# Patient Record
Sex: Female | Born: 1950
Health system: Southern US, Community
[De-identification: ages and names within clinical notes are randomized; demographics above are authoritative.]

## PROBLEM LIST (undated history)

## (undated) DIAGNOSIS — Z8619 Personal history of other infectious and parasitic diseases: Secondary | ICD-10-CM

## (undated) DIAGNOSIS — J449 Chronic obstructive pulmonary disease, unspecified: Secondary | ICD-10-CM

## (undated) DIAGNOSIS — N63 Unspecified lump in unspecified breast: Secondary | ICD-10-CM

## (undated) DIAGNOSIS — E042 Nontoxic multinodular goiter: Secondary | ICD-10-CM

## (undated) HISTORY — PX: ROTATOR CUFF REPAIR: SHX139

## (undated) HISTORY — PX: OTHER SURGICAL HISTORY: SHX169

## (undated) HISTORY — PX: TUBAL LIGATION: SHX77

## (undated) HISTORY — PX: BREAST EXCISIONAL BIOPSY: SUR124

---

## 2000-03-20 ENCOUNTER — Encounter: Admission: RE | Admit: 2000-03-20 | Discharge: 2000-03-20 | Payer: Self-pay | Admitting: Obstetrics and Gynecology

## 2000-03-20 ENCOUNTER — Encounter: Payer: Self-pay | Admitting: Obstetrics and Gynecology

## 2000-05-05 ENCOUNTER — Ambulatory Visit (HOSPITAL_BASED_OUTPATIENT_CLINIC_OR_DEPARTMENT_OTHER): Admission: RE | Admit: 2000-05-05 | Discharge: 2000-05-05 | Payer: Self-pay | Admitting: Orthopedic Surgery

## 2001-03-20 ENCOUNTER — Encounter: Admission: RE | Admit: 2001-03-20 | Discharge: 2001-03-20 | Payer: Self-pay | Admitting: Obstetrics and Gynecology

## 2001-03-20 ENCOUNTER — Encounter: Payer: Self-pay | Admitting: Obstetrics and Gynecology

## 2001-03-23 ENCOUNTER — Other Ambulatory Visit: Admission: RE | Admit: 2001-03-23 | Discharge: 2001-03-23 | Payer: Self-pay | Admitting: General Surgery

## 2001-04-07 ENCOUNTER — Ambulatory Visit (HOSPITAL_COMMUNITY): Admission: RE | Admit: 2001-04-07 | Discharge: 2001-04-07 | Payer: Self-pay | Admitting: General Surgery

## 2001-04-07 ENCOUNTER — Encounter (INDEPENDENT_AMBULATORY_CARE_PROVIDER_SITE_OTHER): Payer: Self-pay | Admitting: Specialist

## 2001-04-07 ENCOUNTER — Encounter: Payer: Self-pay | Admitting: General Surgery

## 2001-09-17 ENCOUNTER — Ambulatory Visit (HOSPITAL_BASED_OUTPATIENT_CLINIC_OR_DEPARTMENT_OTHER): Admission: RE | Admit: 2001-09-17 | Discharge: 2001-09-17 | Payer: Self-pay | Admitting: Obstetrics and Gynecology

## 2001-09-17 ENCOUNTER — Encounter (INDEPENDENT_AMBULATORY_CARE_PROVIDER_SITE_OTHER): Payer: Self-pay | Admitting: Specialist

## 2002-03-29 ENCOUNTER — Encounter: Payer: Self-pay | Admitting: Obstetrics and Gynecology

## 2002-03-29 ENCOUNTER — Encounter: Admission: RE | Admit: 2002-03-29 | Discharge: 2002-03-29 | Payer: Self-pay | Admitting: Obstetrics and Gynecology

## 2003-02-15 ENCOUNTER — Ambulatory Visit (HOSPITAL_BASED_OUTPATIENT_CLINIC_OR_DEPARTMENT_OTHER): Admission: RE | Admit: 2003-02-15 | Discharge: 2003-02-15 | Payer: Self-pay | Admitting: *Deleted

## 2003-05-11 ENCOUNTER — Encounter: Admission: RE | Admit: 2003-05-11 | Discharge: 2003-05-11 | Payer: Self-pay | Admitting: Obstetrics and Gynecology

## 2004-07-27 ENCOUNTER — Encounter: Admission: RE | Admit: 2004-07-27 | Discharge: 2004-07-27 | Payer: Self-pay | Admitting: Obstetrics and Gynecology

## 2005-10-29 ENCOUNTER — Encounter: Admission: RE | Admit: 2005-10-29 | Discharge: 2005-10-29 | Payer: Self-pay | Admitting: Obstetrics and Gynecology

## 2007-01-22 ENCOUNTER — Encounter: Admission: RE | Admit: 2007-01-22 | Discharge: 2007-01-22 | Payer: Self-pay | Admitting: Obstetrics and Gynecology

## 2008-02-23 ENCOUNTER — Encounter: Admission: RE | Admit: 2008-02-23 | Discharge: 2008-02-23 | Payer: Self-pay | Admitting: Obstetrics and Gynecology

## 2009-05-18 ENCOUNTER — Encounter: Admission: RE | Admit: 2009-05-18 | Discharge: 2009-05-18 | Payer: Self-pay | Admitting: Obstetrics and Gynecology

## 2010-05-21 ENCOUNTER — Encounter: Admission: RE | Admit: 2010-05-21 | Discharge: 2010-05-21 | Payer: Self-pay | Admitting: Obstetrics and Gynecology

## 2010-11-16 NOTE — Op Note (Signed)
[place]  Patient:    Cassandra Irwin, Cassandra Irwin Visit Number: [id number] MRN: [id number]          Service Type: DSU Location: DAY Attending Physician:  Carson Myrtle Dictated by:   Sheppard Plumber Earlene Plater, M.D. Proc. Date: 04/07/01 Admit Date:  04/07/2001   CC:         Katherine Roan, M.D.   Operative Report  PREOPERATIVE DIAGNOSIS:  Left breast mass.  POSTOPERATIVE DIAGNOSIS:  Left breast mass.  OPERATION:  Excisional biopsy left breast mass.  SURGEON:  Timothy E. Earlene Plater, M.D.  ANESTHESIA: Local standby.  INDICATIONS:  [name] is well known to me.  She is a 60 year old Caucasian female.  She has had a prior right breast biopsy.  Routine mammography indeed showed what was a palpable mass at the 12 oclock position of the left breast. It was irregular and suspicious.  An office aspiration cytology was suggestive of fibroadenoma.  Because of the suspicion and her age, we decided to do a full excisional biopsy.  DESCRIPTION OF PROCEDURE: The patient was brought to the operating room and placed supine. An IV was started and sedation given.  The left breast was prepped and draped in the usual fashion.  Marcaine 0.25% was epinephrine was used for local anesthesia.  A curvilinear incision was made over the palpable mass and the 12 oclock position of the left breast well outside the areolar edge.  The mass was easily palpable, and I dissected the subcutaneous fatty tissue around and about the mass and did a very generous biopsy including the tissue surrounding the palpable mass.  I encountered severe fibrocystic changes, necrosis, inspissated fluid and multiple cysts.  The entire mass was removed, marked and I personally took it to pathology where it was inked and dissected.  This appears to be extensive fibrocystic disease surrounding an approximately 2 cm fibroadenoma.  We did not freeze the tissue.  The wound was carefully analyzed.  Cautery  was used for coagulation and the wound was dry.  It was approximated with 3-0 Monocryl and the skin closed with 3-0 Monocryl subcuticular.  She tolerated it well.  Steri-Strips and dry sterile dressing were applied.  The counts were correct.  She was removed to the recovery room in good condition.  Written and verbal instructions were given to her and her husband, Dr. Julieanne Manson, and she will be followed as an outpatient. Dictated by:   Sheppard Plumber Earlene Plater, M.D. Attending Physician:  Carson Myrtle DD:  04/07/01 TD:  04/07/01 Job: [id number]

## 2010-11-16 NOTE — Op Note (Signed)
NAME:  Cassandra Irwin, Cassandra Irwin                        ACCOUNT NO.:  [id number]   MEDICAL RECORD NO.:  [id number]                   PATIENT TYPE:  AMB   LOCATION:  DSC                                  FACILITY:  [facility]   PHYSICIAN:  Robert A. Thurston Hole, M.D.              DATE OF BIRTH:  1951/01/18   DATE OF PROCEDURE:  02/15/2003  DATE OF DISCHARGE:                                 OPERATIVE REPORT   PREOPERATIVE DIAGNOSIS:  Right shoulder rotator cuff tendonitis with  adhesive capsulitis and impingement.   POSTOPERATIVE DIAGNOSIS:  Right shoulder rotator cuff tendonitis with  adhesive capsulitis and impingement.   PROCEDURE:  1. Right shoulder examination under anesthesia followed by manipulation.  2. Right shoulder arthroscopy with partial synovectomy.  3. Right shoulder subacromial decompression.   SURGEON:  Elana Alm. Thurston Hole, M.D.   ASSISTANT:  Julien Girt, P.A.   ANESTHESIA:  General.   OPERATIVE TIME:  Forty minutes.   COMPLICATIONS:  None.   INDICATIONS FOR PROCEDURE:  The patient is a 60 year old woman who has had  significant right shoulder pain for the past six to eight weeks increasing  in nature with signs and symptoms and MRI documenting rotator cuff  tendonitis with adhesive capsulitis and impingement.  She has failed  conservative care and is in fact getting worse.  She is now to undergo  arthroscopy and manipulation.   DESCRIPTION OF PROCEDURE:  The patient was brought to the operating room on  February 15, 2003 after an interscalene block had been placed by anesthesia  for postoperative pain control.  She was placed on the operative table in  the supine position.  After being placed under general anesthesia initial  range of motion showed forward flexion to 150, abduction to 150, internal  and external rotation of 50 degrees. A gentle manipulation was carried out  breaking up soft adhesions and improving forward flexion to 180, adduction  to 180 and internal  and external rotation of 90 degrees.  The shoulder  remained stable to ligamentous exam.  After this was done she was placed in  the beach chair position.  Her shoulder and arm was prepped using sterile  Duraprep and draped using sterile technique.  Originally through a posterior  arthroscopic portal, the arthroscope with a pump attached was placed into an  anterior portal and an arthroscopic probe was placed.  On initial inspection  the articular cartilage and the glenohumeral joint were intact.  The  anterior and posterior labrum were intact. The inferior labrum and anterior  inferior glenohumeral ligament complex was intact.  The superior labrum and  the biceps tendon anchor were intact and the biceps tendon was intact.  The  posterior labrum was intact.  There was significant hemorrhagic synovitis  throughout the joint and this was partially debrided.  The rotator cuff was  inspected and there was found to be no evidence of a tear.  The  subacromial  space was entered and a lateral arthroscopic portal was made.  Moderately  thickened bursitis was resected.  The rotator cuff under this was inflamed  and somewhat swollen, but no evidence of a tear was noted. After this was  done a subacromial decompression was carried out removing 6 to 8 mm of the  undersurface of the anterior, anterior lateral and anterior medial acromion.  A CA ligament release was carried out as well.  The acromioclavicular joint  was not disturbed.  After this was done, it was felt that all pathology had  been satisfactorily addressed.  The instruments were removed. A combination  of 40 mg of Depo-Medrol and 6 mL of Marcaine was then injected into the  joint to help relieve the synovitis.  The arthroscopic portals were then  closed with 3-0 nylon and then sterile dressings were applied and a sling  applied. The patient was then awakened and taken to the recovery room in  stable condition.   FOLLOW-UP CARE:  The  patient will be followed as an outpatient on Percocet  for pain with early physical therapy.  She will return to the office in a  week for sutures out and follow-up.                                               Robert A. Thurston Hole, M.D.    RAW/MEDQ  D:  02/15/2003  T:  02/15/2003  Job:  [id number]

## 2010-11-16 NOTE — Op Note (Signed)
[facility]  Patient:    Cassandra Irwin, Cassandra Irwin Visit Number: [id number] MRN: [id number]          Service Type: NES Location: NESC Attending Physician:  Lendon Colonel Dictated by:   Kathie Rhodes. Kyra Manges, M.D. Proc. Date: 09/17/01 Admit Date:  09/17/2001                             Operative Report  PREOPERATIVE DIAGNOSIS:  Abnormal uterine bleeding.  POSTOPERATIVE DIAGNOSIS:  Abnormal uterine bleeding.  OPERATION PERFORMED:  Hysteroscopy with resection of submucous fibroid in the lower segment and fundal polyp.  DESCRIPTION OF PROCEDURE:  The patient was placed in lithotomy position, prepped and draped in the usual fashion. The cervix carefully dilated and endometrial cavity was then visualized. There was a Schreffler cervical fibroid which was resected and then I was able to put the scope down to the polyp in the fundus. I resected this without difficulty. All of the resecting material was sent to the lab for study. Nakeesha was awakened and carried to the recovery room in good condition. Dictated by:   S. Kyra Manges, M.D. Attending Physician:  Lendon Colonel DD:  09/17/01 TD:  09/18/01 Job: [id number]

## 2010-11-16 NOTE — Op Note (Signed)
[place]  Patient:    Cassandra Irwin, Cassandra Irwin                     MRN: [id number] Proc. Date: 05/05/00 Adm. Date:  [id number] Attending:  Twana First                           Operative Report  PREOPERATIVE DIAGNOSIS:  Left shoulder adhesive capsulitis.  POSTOPERATIVE DIAGNOSES: 1. Left shoulder adhesive capsulitis. 2. Left shoulder partial rotator cuff tear and impingement.  PROCEDURES: 1. Left shoulder EUA followed by manipulation. 2. Left shoulder arthroscopic lysis of adhesions. 3. Left shoulder partial rotator cuff tear debridement and subacromial    decompression.  SURGEON:  Elana Alm. Thurston Hole, M.D.  ASSISTANT:  Kirstin A. Shepperson, P.A.  ANESTHESIA:  General.  OPERATIVE TIME:  Forty minutes.  COMPLICATIONS:  None.  INDICATIONS FOR PROCEDURE:  Cassandra Irwin is a 60 year old woman who has had four to five months of increasing left shoulder pain with decreased range of motion, with MRI and exam documenting rotator cuff tendonitis and adhesive capsulitis.  She is now to undergo EUA manipulation and arthroscopy.  DESCRIPTION:  Cassandra Irwin was brought to the operating room on May 05, 2000 after a supraclavicular block had been placed in the holding room, placed on the operative table in a supine position.  After being placed under general anesthesia, her left shoulder was examined under anesthesia.  Forward flexion to 170, abduction to 150, internal and external rotation of 50 degrees.  A gentle manipulation was carried out, breaking up soft adhesions and improving forward flexion to 180, abduction to 180, internal and external rotation of 90 degrees. The shoulder remained stable ligamentous exam.  Subacromial space and joint were injected with saline and epinephrine, and then she was placed in a beach chair position.  Her shoulder and arm were prepped using sterile Betadine and draped using sterile technique.  Initially,  through a posterior arthroscopic portal, the arthroscope with a pump attachment was placed into an anterior portal, and arthroscopic probe was placed.  On initial inspection, she was found to have significant hemorrhagic synovitis in the joint.  Articular cartilage on the glenoid and humerus were intact.  Anterior labrum with partial tearing - 20%, which was debrided. Superior labrum and biceps tendon anchor were intact.  Biceps tendon was intact.  Inferior labrum and anterior-inferior glenohumeral ligaments were intact.  Posterior labrum was intact.  Rotator cuff was thoroughly inspected. She had a partial tear of the supraspinatus - 20%, just above the biceps tendon, which was debrided.  The rest of the rotator cuff was found to be intact.  Inferior capsular recess was inflamed, but otherwise intact.  The subacromial space was then entered, and a lateral arthroscopic portal was made.  Moderately thickened bursitis was resected.  The rotator cuff underneath this was found to be thickened and inflamed, but no evidence of a tear.  Partial bursectomy was carried out, subacromial decompression was carried out removing 6-8 mm of the undersurface of the anterior, anterolateral, and anteromedial achromia.  CA ligament was released.  AC joint was not disturbed.  After this was done, the shoulder could be brought through a full range of motion with no impingement on the rotator cuff.  At this point, it was felt that all pathology had been satisfactorily addressed.  The instruments were removed.  Portals were closed with 3-0 nylon suture and injected with  0.25% Marcaine with Epinephrine and 5 mg of morphine. Sterile dressings were applied.  The patient was awakened and taken to the recovery room in stable condition.  FOLLOW-UP CARE:  Cassandra Irwin will be followed as an outpatient, on Vicodin for pain.  Will see her back in the office in a week for sutures out and follow-up.  Begin early, aggressive  physical therapy. DD:  05/05/00 TD:  05/05/00 Job: [id number]

## 2012-02-19 ENCOUNTER — Other Ambulatory Visit: Payer: Self-pay | Admitting: Obstetrics & Gynecology

## 2012-02-19 DIAGNOSIS — Z1231 Encounter for screening mammogram for malignant neoplasm of breast: Secondary | ICD-10-CM

## 2012-02-19 DIAGNOSIS — N63 Unspecified lump in unspecified breast: Secondary | ICD-10-CM

## 2012-02-26 ENCOUNTER — Other Ambulatory Visit: Payer: Self-pay | Admitting: Obstetrics & Gynecology

## 2012-02-26 DIAGNOSIS — N63 Unspecified lump in unspecified breast: Secondary | ICD-10-CM

## 2012-02-28 ENCOUNTER — Ambulatory Visit
Admission: RE | Admit: 2012-02-28 | Discharge: 2012-02-28 | Disposition: A | Discharge status: Home / Self Care | Payer: BC Managed Care – PPO | Source: Ambulatory Visit | Attending: Obstetrics & Gynecology | Admitting: Obstetrics & Gynecology

## 2012-02-28 DIAGNOSIS — N63 Unspecified lump in unspecified breast: Secondary | ICD-10-CM

## 2012-03-06 ENCOUNTER — Ambulatory Visit: Payer: Self-pay

## 2012-03-16 LAB — HM DEXA SCAN: HM Dexa Scan: NORMAL

## 2013-02-16 ENCOUNTER — Other Ambulatory Visit: Payer: Self-pay

## 2013-02-16 DIAGNOSIS — Z1231 Encounter for screening mammogram for malignant neoplasm of breast: Secondary | ICD-10-CM

## 2013-03-09 ENCOUNTER — Ambulatory Visit
Admission: RE | Admit: 2013-03-09 | Discharge: 2013-03-09 | Disposition: A | Discharge status: Home / Self Care | Payer: BC Managed Care – PPO | Source: Ambulatory Visit

## 2013-03-09 DIAGNOSIS — Z1231 Encounter for screening mammogram for malignant neoplasm of breast: Secondary | ICD-10-CM

## 2014-02-08 ENCOUNTER — Other Ambulatory Visit: Payer: Self-pay

## 2014-02-08 DIAGNOSIS — Z1231 Encounter for screening mammogram for malignant neoplasm of breast: Secondary | ICD-10-CM

## 2014-03-11 ENCOUNTER — Ambulatory Visit: Payer: BC Managed Care – PPO

## 2014-03-22 ENCOUNTER — Ambulatory Visit
Admission: RE | Admit: 2014-03-22 | Discharge: 2014-03-22 | Disposition: A | Discharge status: Home / Self Care | Payer: BC Managed Care – PPO | Source: Ambulatory Visit

## 2014-03-22 DIAGNOSIS — Z1231 Encounter for screening mammogram for malignant neoplasm of breast: Secondary | ICD-10-CM

## 2015-01-10 ENCOUNTER — Encounter: Payer: Self-pay | Admitting: Family Medicine

## 2015-01-10 ENCOUNTER — Ambulatory Visit (INDEPENDENT_AMBULATORY_CARE_PROVIDER_SITE_OTHER): Payer: BLUE CROSS/BLUE SHIELD | Admitting: Family Medicine

## 2015-01-10 VITALS — BP 120/72 | HR 98 | Wt 193.0 lb

## 2015-01-10 DIAGNOSIS — Z79890 Hormone replacement therapy (postmenopausal): Secondary | ICD-10-CM

## 2015-01-10 DIAGNOSIS — B0221 Postherpetic geniculate ganglionitis: Secondary | ICD-10-CM

## 2015-01-10 DIAGNOSIS — F17200 Nicotine dependence, unspecified, uncomplicated: Secondary | ICD-10-CM | POA: Insufficient documentation

## 2015-01-10 DIAGNOSIS — E785 Hyperlipidemia, unspecified: Secondary | ICD-10-CM | POA: Diagnosis not present

## 2015-01-10 DIAGNOSIS — Z8249 Family history of ischemic heart disease and other diseases of the circulatory system: Secondary | ICD-10-CM | POA: Insufficient documentation

## 2015-01-10 DIAGNOSIS — B029 Zoster without complications: Secondary | ICD-10-CM

## 2015-01-10 DIAGNOSIS — Z720 Tobacco use: Secondary | ICD-10-CM | POA: Diagnosis not present

## 2015-01-10 LAB — CBC WITH DIFFERENTIAL/PLATELET
Basophils Absolute: 0.1 10*3/uL (ref 0.0–0.1)
Basophils Relative: 1 % (ref 0–1)
Eosinophils Absolute: 0.1 10*3/uL (ref 0.0–0.7)
Eosinophils Relative: 1 % (ref 0–5)
HEMATOCRIT: 47 % — AB (ref 36.0–46.0)
Hemoglobin: 15.6 g/dL — ABNORMAL HIGH (ref 12.0–15.0)
LYMPHS ABS: 0.9 10*3/uL (ref 0.7–4.0)
LYMPHS PCT: 13 % (ref 12–46)
MCH: 28.8 pg (ref 26.0–34.0)
MCHC: 33.2 g/dL (ref 30.0–36.0)
MCV: 86.7 fL (ref 78.0–100.0)
MONO ABS: 0.7 10*3/uL (ref 0.1–1.0)
MONOS PCT: 10 % (ref 3–12)
MPV: 10.3 fL (ref 8.6–12.4)
NEUTROS ABS: 5 10*3/uL (ref 1.7–7.7)
Neutrophils Relative %: 75 % (ref 43–77)
PLATELETS: 216 10*3/uL (ref 150–400)
RBC: 5.42 MIL/uL — AB (ref 3.87–5.11)
RDW: 13.9 % (ref 11.5–15.5)
WBC: 6.6 10*3/uL (ref 4.0–10.5)

## 2015-01-10 MED ORDER — VALACYCLOVIR HCL 1 G PO TABS
1000.0000 mg | ORAL_TABLET | Freq: Three times a day (TID) | ORAL | Status: DC
Start: 2015-01-10 — End: 2015-03-14

## 2015-01-10 MED ORDER — ATORVASTATIN CALCIUM 40 MG PO TABS
40.0000 mg | ORAL_TABLET | Freq: Every day | ORAL | Status: DC
Start: 2015-01-10 — End: 2016-02-08

## 2015-01-10 NOTE — Progress Notes (Signed)
   Subjective:    Patient ID: Cassandra Irwin, female    DOB: 1951-06-30, 64 y.o.   MRN: [id number]  HPI She is here for evaluation of a rash present on the left face in the cheek area. It is causing very [place] difficulty. She did note some left ear pain a day or 2 prior to the rash occurring. She has also noted some erythema in her mouth. She also has a history of hyperlipidemia and had been on Crestor in the past. She would like to be switched to a less expensive medication. Apparently her gynecologist was given this medicine to her. She does continue to smoke and does have a family history of heart disease with her father having heart attack at age 29. She is also on HRT and apparently given this mainly for the cardiac benefits by her gynecologist.   Review of Systems     Objective:   Physical Exam Alert and in no distress. Left tympanic membrane is erythematous. The canal slightly erythematous. Right TM is normal. Exam of the mouth does show erythema on the left buccal mucosa. Neck is supple without adenopathy. The left cheek area does show slight induration and early vesicle formulation.       Assessment & Plan:  Shingles - Plan: valACYclovir (VALTREX) 1000 MG tablet  Ramsay Hunt syndrome (geniculate herpes zoster) - Plan: valACYclovir (VALTREX) 1000 MG tablet  Hyperlipidemia LDL goal <70 - Plan: Lipid panel, atorvastatin (LIPITOR) 40 MG tablet  Current smoker  Family history of heart disease - Plan: CBC with Differential/Platelet, Comprehensive metabolic panel, Lipid panel  Hormone replacement therapy (HRT) I will place her on Valtrex. Recommend ibuprofen for pain relief and if not beneficial, they will call me. Discussed Ramsey Hunt syndrome with them in regard to the herpes. I will also switch her to Lipitor and do blood work. We did not discuss cigarette smoking in any detail. I also discussed HRT with her and my recommendation would be to stop this. They will call if further  trouble.

## 2015-01-10 NOTE — Patient Instructions (Signed)
800 milligrams of ibuprofen 3 times a day as needed for pain.That doesn't work call me

## 2015-01-11 LAB — LIPID PANEL
CHOL/HDL RATIO: 5.9 ratio
CHOLESTEROL: 211 mg/dL — AB (ref 0–200)
HDL: 36 mg/dL — AB (ref >=46)
TRIGLYCERIDES: 470 mg/dL — AB (ref <150)

## 2015-01-11 LAB — COMPREHENSIVE METABOLIC PANEL
ALK PHOS: 62 U/L (ref 39–117)
ALT: 11 U/L (ref 0–35)
AST: 16 U/L (ref 0–37)
Albumin: 4.3 g/dL (ref 3.5–5.2)
BUN: 11 mg/dL (ref 6–23)
CHLORIDE: 103 meq/L (ref 96–112)
CO2: 25 mEq/L (ref 19–32)
Calcium: 9.1 mg/dL (ref 8.4–10.5)
Creat: 0.82 mg/dL (ref 0.50–1.10)
GLUCOSE: 107 mg/dL — AB (ref 70–99)
POTASSIUM: 4.1 meq/L (ref 3.5–5.3)
SODIUM: 141 meq/L (ref 135–145)
TOTAL PROTEIN: 6.5 g/dL (ref 6.0–8.3)
Total Bilirubin: 1.1 mg/dL (ref 0.2–1.2)

## 2015-02-13 ENCOUNTER — Ambulatory Visit (INDEPENDENT_AMBULATORY_CARE_PROVIDER_SITE_OTHER): Payer: BLUE CROSS/BLUE SHIELD | Admitting: Family Medicine

## 2015-02-13 ENCOUNTER — Encounter: Payer: Self-pay | Admitting: Family Medicine

## 2015-02-13 VITALS — BP 118/80 | HR 80 | Wt 196.0 lb

## 2015-02-13 DIAGNOSIS — B0229 Other postherpetic nervous system involvement: Secondary | ICD-10-CM | POA: Diagnosis not present

## 2015-02-13 MED ORDER — HYDROCODONE-ACETAMINOPHEN 5-325 MG PO TABS: 1.0000 | ORAL_TABLET | Freq: Four times a day (QID) | ORAL | Status: DC | PRN

## 2015-02-13 MED ORDER — AMITRIPTYLINE HCL 10 MG PO TABS: 10.0000 mg | ORAL_TABLET | Freq: Every day | ORAL | Status: DC

## 2015-02-13 NOTE — Progress Notes (Signed)
   Subjective:    Patient ID: Cassandra Irwin, female    DOB: [birth year], 64 y.o.   MRN: [id number]  HPI He is here for consult concerning continued difficulty with left facial pain after having shingles. She states that she is really made no progress the last several weeks. Complains of intermittent stabbing type pain in the facial area and to a lesser extent in the ear. She is also noted some issues with her tongue and mucosa.   Review of Systems     Objective:   Physical Exam Page Spiro and in no distress. Slight changes are noted on the left side of her face.       Assessment & Plan:  PHN (postherpetic neuralgia) - Plan: amitriptyline (ELAVIL) 10 MG tablet, HYDROcodone-acetaminophen (NORCO) 5-325 MG per tablet I discussed various options with her. I will start her on amitriptyline and increase  this to 50 mgAnd if no improvement then try her on Lyrica. She is comfortable with this approach.

## 2015-02-13 NOTE — Patient Instructions (Signed)
Start with 1 amitriptyline night and if after several days, no improvement, take 2.

## 2015-02-21 ENCOUNTER — Other Ambulatory Visit: Payer: Self-pay

## 2015-02-21 DIAGNOSIS — Z1231 Encounter for screening mammogram for malignant neoplasm of breast: Secondary | ICD-10-CM

## 2015-03-14 ENCOUNTER — Encounter: Payer: Self-pay | Admitting: Family Medicine

## 2015-03-14 ENCOUNTER — Ambulatory Visit (INDEPENDENT_AMBULATORY_CARE_PROVIDER_SITE_OTHER): Payer: BLUE CROSS/BLUE SHIELD | Admitting: Family Medicine

## 2015-03-14 VITALS — BP 120/74 | HR 64 | Wt 192.2 lb

## 2015-03-14 DIAGNOSIS — Z23 Encounter for immunization: Secondary | ICD-10-CM | POA: Diagnosis not present

## 2015-03-14 DIAGNOSIS — B0229 Other postherpetic nervous system involvement: Secondary | ICD-10-CM | POA: Diagnosis not present

## 2015-03-14 DIAGNOSIS — E785 Hyperlipidemia, unspecified: Secondary | ICD-10-CM

## 2015-03-14 DIAGNOSIS — Z720 Tobacco use: Secondary | ICD-10-CM | POA: Diagnosis not present

## 2015-03-14 DIAGNOSIS — F17200 Nicotine dependence, unspecified, uncomplicated: Secondary | ICD-10-CM

## 2015-03-14 LAB — LIPID PANEL
CHOL/HDL RATIO: 4.7 ratio (ref <=5.0)
CHOLESTEROL: 197 mg/dL (ref 125–200)
HDL: 42 mg/dL — ABNORMAL LOW (ref >=46)
LDL CALC: 108 mg/dL (ref <130)
Triglycerides: 237 mg/dL — ABNORMAL HIGH (ref <150)
VLDL: 47 mg/dL — AB (ref <30)

## 2015-03-14 NOTE — Progress Notes (Signed)
   Subjective:    Patient ID: Burman Nieves, female    DOB: 02-21-1951, 64 y.o.   MRN: [id number]  HPI He is here for follow-up on her PHN. The shooting pains have gone completely since starting the amitriptyline. She has having some intermittent ear pain as well as chin pain. She does note numbness in her mouth on the left-hand side but states that this is actually better than the initial issue.She is now taking Lipitor and having no muscle aches or pains. Review of the record indicates she is smoking but apparently has made some minor changes to cut back.   Review of Systems     Objective:   Physical Exam Alert and in no distress otherwise not examined       Assessment & Plan:  PHN (postherpetic neuralgia)  Need for prophylactic vaccination and inoculation against influenza  Hyperlipidemia LDL goal <70  Current smoker I will have her increase the amitriptyline to 20 mg.She will call me after about a week. Discussed keeping her on this medication for the time being. We will readdress the situation at some point down the road, possibly when she has return of her sensation in the mouth. Also encouraged her to eat mainly on the right side of the mouth to keep from biting her total or mouth. Also discussed smoking cessation with her. She will continue to make changes in that regard.

## 2015-03-14 NOTE — Patient Instructions (Signed)
Take 2 of the amitriptyline for the next week and let me know

## 2015-03-22 ENCOUNTER — Telehealth: Payer: Self-pay | Admitting: Family Medicine

## 2015-03-22 MED ORDER — AMITRIPTYLINE HCL 25 MG PO TABS
25.0000 mg | ORAL_TABLET | Freq: Every day | ORAL | Status: DC
Start: 2015-03-22 — End: 2015-11-23

## 2015-03-22 NOTE — Telephone Encounter (Signed)
Pt called and stated the new dose of

## 2015-03-22 NOTE — Telephone Encounter (Signed)
Pt called and stated that the new dose of elavil is working much better. She states she will be running out of medication due to the new dose. Please send in refills to [facility] on [address]. Pt can be reached at [phone]

## 2015-03-30 ENCOUNTER — Ambulatory Visit: Payer: Self-pay

## 2015-04-06 ENCOUNTER — Ambulatory Visit
Admission: RE | Admit: 2015-04-06 | Discharge: 2015-04-06 | Disposition: A | Discharge status: Home / Self Care | Payer: BLUE CROSS/BLUE SHIELD | Source: Ambulatory Visit

## 2015-04-06 DIAGNOSIS — Z1231 Encounter for screening mammogram for malignant neoplasm of breast: Secondary | ICD-10-CM

## 2015-04-07 ENCOUNTER — Other Ambulatory Visit: Payer: Self-pay | Admitting: Obstetrics & Gynecology

## 2015-04-07 DIAGNOSIS — R928 Other abnormal and inconclusive findings on diagnostic imaging of breast: Secondary | ICD-10-CM

## 2015-04-17 ENCOUNTER — Ambulatory Visit
Admission: RE | Admit: 2015-04-17 | Discharge: 2015-04-17 | Disposition: A | Discharge status: Home / Self Care | Payer: BLUE CROSS/BLUE SHIELD | Source: Ambulatory Visit | Attending: Obstetrics & Gynecology | Admitting: Obstetrics & Gynecology

## 2015-04-17 DIAGNOSIS — R928 Other abnormal and inconclusive findings on diagnostic imaging of breast: Secondary | ICD-10-CM

## 2015-09-22 ENCOUNTER — Other Ambulatory Visit: Payer: Self-pay | Admitting: Obstetrics & Gynecology

## 2015-09-22 DIAGNOSIS — N6320 Unspecified lump in the left breast, unspecified quadrant: Secondary | ICD-10-CM

## 2015-09-22 DIAGNOSIS — N644 Mastodynia: Secondary | ICD-10-CM

## 2015-10-06 ENCOUNTER — Ambulatory Visit
Admission: RE | Admit: 2015-10-06 | Discharge: 2015-10-06 | Disposition: A | Discharge status: Home / Self Care | Payer: BLUE CROSS/BLUE SHIELD | Source: Ambulatory Visit | Attending: Obstetrics & Gynecology | Admitting: Obstetrics & Gynecology

## 2015-10-06 ENCOUNTER — Other Ambulatory Visit: Payer: Self-pay | Admitting: Obstetrics & Gynecology

## 2015-10-06 DIAGNOSIS — N6320 Unspecified lump in the left breast, unspecified quadrant: Secondary | ICD-10-CM

## 2015-10-06 DIAGNOSIS — N644 Mastodynia: Secondary | ICD-10-CM

## 2015-10-16 ENCOUNTER — Ambulatory Visit
Admission: RE | Admit: 2015-10-16 | Discharge: 2015-10-16 | Disposition: A | Discharge status: Home / Self Care | Payer: BLUE CROSS/BLUE SHIELD | Source: Ambulatory Visit | Attending: Obstetrics & Gynecology | Admitting: Obstetrics & Gynecology

## 2015-10-16 ENCOUNTER — Other Ambulatory Visit: Payer: Self-pay | Admitting: Obstetrics & Gynecology

## 2015-10-16 DIAGNOSIS — N6320 Unspecified lump in the left breast, unspecified quadrant: Secondary | ICD-10-CM

## 2015-11-02 ENCOUNTER — Other Ambulatory Visit: Payer: Self-pay | Admitting: General Surgery

## 2015-11-02 DIAGNOSIS — N6489 Other specified disorders of breast: Secondary | ICD-10-CM

## 2015-11-03 ENCOUNTER — Other Ambulatory Visit: Payer: Self-pay | Admitting: General Surgery

## 2015-11-03 DIAGNOSIS — N6489 Other specified disorders of breast: Secondary | ICD-10-CM

## 2015-11-23 ENCOUNTER — Encounter (HOSPITAL_BASED_OUTPATIENT_CLINIC_OR_DEPARTMENT_OTHER): Payer: Self-pay | Admitting: *Deleted

## 2015-11-28 ENCOUNTER — Ambulatory Visit
Admission: RE | Admit: 2015-11-28 | Discharge: 2015-11-28 | Disposition: A | Discharge status: Home / Self Care | Payer: BLUE CROSS/BLUE SHIELD | Source: Ambulatory Visit | Attending: General Surgery | Admitting: General Surgery

## 2015-11-28 DIAGNOSIS — N6489 Other specified disorders of breast: Secondary | ICD-10-CM

## 2015-11-28 NOTE — Pre-Procedure Instructions (Signed)
Pt in to pick up [facility], instructions reviewed with pt. To drink by 0515.

## 2015-11-29 ENCOUNTER — Ambulatory Visit (HOSPITAL_BASED_OUTPATIENT_CLINIC_OR_DEPARTMENT_OTHER): Payer: BLUE CROSS/BLUE SHIELD | Admitting: Anesthesiology

## 2015-11-29 ENCOUNTER — Ambulatory Visit (HOSPITAL_BASED_OUTPATIENT_CLINIC_OR_DEPARTMENT_OTHER)
Admission: RE | Admit: 2015-11-29 | Discharge: 2015-11-29 | Disposition: A | Discharge status: Home / Self Care | Payer: BLUE CROSS/BLUE SHIELD | Source: Ambulatory Visit | Attending: General Surgery | Admitting: General Surgery

## 2015-11-29 ENCOUNTER — Encounter (HOSPITAL_BASED_OUTPATIENT_CLINIC_OR_DEPARTMENT_OTHER)
Admission: RE | Disposition: A | Discharge status: Home / Self Care | Payer: Self-pay | Source: Ambulatory Visit | Attending: General Surgery

## 2015-11-29 ENCOUNTER — Ambulatory Visit
Admission: RE | Admit: 2015-11-29 | Discharge: 2015-11-29 | Disposition: A | Discharge status: Home / Self Care | Payer: BLUE CROSS/BLUE SHIELD | Source: Ambulatory Visit | Attending: General Surgery | Admitting: General Surgery

## 2015-11-29 ENCOUNTER — Encounter (HOSPITAL_BASED_OUTPATIENT_CLINIC_OR_DEPARTMENT_OTHER): Payer: Self-pay | Admitting: Certified Registered"

## 2015-11-29 DIAGNOSIS — E669 Obesity, unspecified: Secondary | ICD-10-CM | POA: Diagnosis not present

## 2015-11-29 DIAGNOSIS — N6489 Other specified disorders of breast: Secondary | ICD-10-CM | POA: Diagnosis not present

## 2015-11-29 DIAGNOSIS — Z6830 Body mass index (BMI) 30.0-30.9, adult: Secondary | ICD-10-CM | POA: Insufficient documentation

## 2015-11-29 DIAGNOSIS — F17200 Nicotine dependence, unspecified, uncomplicated: Secondary | ICD-10-CM | POA: Insufficient documentation

## 2015-11-29 HISTORY — DX: Personal history of other infectious and parasitic diseases: Z86.19

## 2015-11-29 HISTORY — PX: RADIOACTIVE SEED GUIDED EXCISIONAL BREAST BIOPSY: SHX6490

## 2015-11-29 HISTORY — DX: Unspecified lump in unspecified breast: N63.0

## 2015-11-29 SURGERY — RADIOACTIVE SEED GUIDED BREAST BIOPSY
Anesthesia: General | Site: Breast | Laterality: Left

## 2015-11-29 MED ORDER — MIDAZOLAM HCL 2 MG/2ML IJ SOLN
INTRAMUSCULAR | Status: AC
  Filled 2015-11-29: qty 2

## 2015-11-29 MED ORDER — ONDANSETRON HCL 4 MG/2ML IJ SOLN
INTRAMUSCULAR | Status: AC
  Filled 2015-11-29: qty 2

## 2015-11-29 MED ORDER — PHENYLEPHRINE HCL 10 MG/ML IJ SOLN
INTRAMUSCULAR | Status: DC | PRN
  Administered 2015-11-29 (×2): 40 ug via INTRAVENOUS

## 2015-11-29 MED ORDER — CEFAZOLIN SODIUM-DEXTROSE 2-4 GM/100ML-% IV SOLN
INTRAVENOUS | Status: AC
  Filled 2015-11-29: qty 100

## 2015-11-29 MED ORDER — LACTATED RINGERS IV SOLN
INTRAVENOUS | Status: DC
Start: 2015-11-29 — End: 2015-11-29
  Administered 2015-11-29 (×2): via INTRAVENOUS

## 2015-11-29 MED ORDER — CEFAZOLIN SODIUM-DEXTROSE 2-4 GM/100ML-% IV SOLN
2.0000 g | INTRAVENOUS | Status: AC
  Administered 2015-11-29: 2 g via INTRAVENOUS

## 2015-11-29 MED ORDER — HYDROCODONE-ACETAMINOPHEN 10-325 MG PO TABS: 1.0000 | ORAL_TABLET | Freq: Four times a day (QID) | ORAL | Status: DC | PRN

## 2015-11-29 MED ORDER — SCOPOLAMINE 1 MG/3DAYS TD PT72: 1.0000 | MEDICATED_PATCH | Freq: Once | TRANSDERMAL | Status: DC | PRN

## 2015-11-29 MED ORDER — PROPOFOL 500 MG/50ML IV EMUL
INTRAVENOUS | Status: AC
  Filled 2015-11-29: qty 50

## 2015-11-29 MED ORDER — LIDOCAINE HCL (CARDIAC) 20 MG/ML IV SOLN
INTRAVENOUS | Status: DC | PRN
  Administered 2015-11-29: 60 mg via INTRAVENOUS

## 2015-11-29 MED ORDER — GLYCOPYRROLATE 0.2 MG/ML IJ SOLN: 0.2000 mg | Freq: Once | INTRAMUSCULAR | Status: DC | PRN

## 2015-11-29 MED ORDER — LIDOCAINE 2% (20 MG/ML) 5 ML SYRINGE
INTRAMUSCULAR | Status: AC
  Filled 2015-11-29: qty 5

## 2015-11-29 MED ORDER — ONDANSETRON HCL 4 MG/2ML IJ SOLN: 4.0000 mg | Freq: Once | INTRAMUSCULAR | Status: DC | PRN

## 2015-11-29 MED ORDER — BUPIVACAINE HCL (PF) 0.25 % IJ SOLN
INTRAMUSCULAR | Status: DC | PRN
  Administered 2015-11-29: 17 mL

## 2015-11-29 MED ORDER — BUPIVACAINE HCL (PF) 0.25 % IJ SOLN
INTRAMUSCULAR | Status: AC
  Filled 2015-11-29: qty 60

## 2015-11-29 MED ORDER — PROPOFOL 10 MG/ML IV BOLUS
INTRAVENOUS | Status: DC | PRN
  Administered 2015-11-29: 150 mg via INTRAVENOUS

## 2015-11-29 MED ORDER — FENTANYL CITRATE (PF) 100 MCG/2ML IJ SOLN: 25.0000 ug | INTRAMUSCULAR | Status: DC | PRN

## 2015-11-29 MED ORDER — DEXAMETHASONE SODIUM PHOSPHATE 10 MG/ML IJ SOLN
INTRAMUSCULAR | Status: AC
  Filled 2015-11-29: qty 1

## 2015-11-29 MED ORDER — ONDANSETRON HCL 4 MG/2ML IJ SOLN
INTRAMUSCULAR | Status: DC | PRN
  Administered 2015-11-29: 4 mg via INTRAVENOUS

## 2015-11-29 MED ORDER — DEXAMETHASONE SODIUM PHOSPHATE 4 MG/ML IJ SOLN
INTRAMUSCULAR | Status: DC | PRN
  Administered 2015-11-29: 10 mg via INTRAVENOUS

## 2015-11-29 MED ORDER — FENTANYL CITRATE (PF) 100 MCG/2ML IJ SOLN
INTRAMUSCULAR | Status: AC
  Filled 2015-11-29: qty 2

## 2015-11-29 MED ORDER — FENTANYL CITRATE (PF) 100 MCG/2ML IJ SOLN
50.0000 ug | INTRAMUSCULAR | Status: DC | PRN
  Administered 2015-11-29: 50 ug via INTRAVENOUS

## 2015-11-29 MED ORDER — MIDAZOLAM HCL 2 MG/2ML IJ SOLN
1.0000 mg | INTRAMUSCULAR | Status: DC | PRN
  Administered 2015-11-29: 1 mg via INTRAVENOUS

## 2015-11-29 SURGICAL SUPPLY — 40 items
BINDER BREAST XLRG (GAUZE/BANDAGES/DRESSINGS) IMPLANT
BLADE SURG 15 STRL LF DISP TIS (BLADE) ×1 IMPLANT
BLADE SURG 15 STRL SS (BLADE) ×2
CHLORAPREP W/TINT 26ML (MISCELLANEOUS) ×3 IMPLANT
COVER BACK TABLE 60X90IN (DRAPES) ×3 IMPLANT
COVER MAYO STAND STRL (DRAPES) ×3 IMPLANT
COVER PROBE W GEL 5X96 (DRAPES) ×3 IMPLANT
DEVICE DUBIN W/COMP PLATE 8390 (MISCELLANEOUS) ×3 IMPLANT
DRAPE LAPAROSCOPIC ABDOMINAL (DRAPES) ×3 IMPLANT
DRAPE UTILITY XL STRL (DRAPES) ×3 IMPLANT
ELECT COATED BLADE 2.86 ST (ELECTRODE) ×3 IMPLANT
ELECT REM PT RETURN 9FT ADLT (ELECTROSURGICAL) ×3
ELECTRODE REM PT RTRN 9FT ADLT (ELECTROSURGICAL) ×1 IMPLANT
GLOVE BIO SURGEON STRL SZ 6 (GLOVE) ×3 IMPLANT
GLOVE BIO SURGEON STRL SZ7 (GLOVE) ×6 IMPLANT
GLOVE BIOGEL PI IND STRL 6.5 (GLOVE) ×1 IMPLANT
GLOVE BIOGEL PI IND STRL 7.0 (GLOVE) ×2 IMPLANT
GLOVE BIOGEL PI IND STRL 7.5 (GLOVE) ×1 IMPLANT
GLOVE BIOGEL PI INDICATOR 6.5 (GLOVE) ×2
GLOVE BIOGEL PI INDICATOR 7.0 (GLOVE) ×4
GLOVE BIOGEL PI INDICATOR 7.5 (GLOVE) ×2
GLOVE ECLIPSE 6.5 STRL STRAW (GLOVE) ×3 IMPLANT
GOWN STRL REUS W/ TWL LRG LVL3 (GOWN DISPOSABLE) ×3 IMPLANT
GOWN STRL REUS W/TWL LRG LVL3 (GOWN DISPOSABLE) ×6
ILLUMINATOR WAVEGUIDE N/F (MISCELLANEOUS) ×3 IMPLANT
KIT MARKER MARGIN INK (KITS) ×3 IMPLANT
LIQUID BAND (GAUZE/BANDAGES/DRESSINGS) ×3 IMPLANT
NEEDLE HYPO 25X1 1.5 SAFETY (NEEDLE) ×3 IMPLANT
PACK BASIN DAY SURGERY FS (CUSTOM PROCEDURE TRAY) ×3 IMPLANT
PENCIL BUTTON HOLSTER BLD 10FT (ELECTRODE) ×3 IMPLANT
SLEEVE SCD COMPRESS KNEE MED (MISCELLANEOUS) ×3 IMPLANT
SPONGE LAP 4X18 X RAY DECT (DISPOSABLE) ×3 IMPLANT
SUT MON AB 5-0 PS2 18 (SUTURE) ×3 IMPLANT
SUT VIC AB 2-0 SH 27 (SUTURE) ×2
SUT VIC AB 2-0 SH 27XBRD (SUTURE) ×1 IMPLANT
SUT VIC AB 3-0 SH 27 (SUTURE) ×2
SUT VIC AB 3-0 SH 27X BRD (SUTURE) ×1 IMPLANT
SYR CONTROL 10ML LL (SYRINGE) ×3 IMPLANT
TOWEL OR 17X24 6PK STRL BLUE (TOWEL DISPOSABLE) ×3 IMPLANT
TOWEL OR NON WOVEN STRL DISP B (DISPOSABLE) ×3 IMPLANT

## 2015-11-29 NOTE — Anesthesia Procedure Notes (Signed)
Procedure Name: LMA Insertion Date/Time: 11/29/2015 9:13 AM Performed by: Quana Chamberlain D Pre-anesthesia Checklist: Patient identified, Emergency Drugs available, Suction available and Patient being monitored Patient Re-evaluated:Patient Re-evaluated prior to inductionOxygen Delivery Method: Circle System Utilized Preoxygenation: Pre-oxygenation with 100% oxygen Intubation Type: IV induction Ventilation: Mask ventilation without difficulty LMA: LMA inserted LMA Size: 4.0 Number of attempts: 1 Airway Equipment and Method: Bite block Placement Confirmation: positive ETCO2 Tube secured with: Tape Dental Injury: Teeth and Oropharynx as per pre-operative assessment

## 2015-11-29 NOTE — Anesthesia Postprocedure Evaluation (Signed)
Anesthesia Post Note  Patient: Cassandra Irwin  Procedure(s) Performed: Procedure(s) (LRB): RADIOACTIVE SEED GUIDED EXCISIONAL LEFT BREAST BIOPSY (Left)  Patient location during evaluation: PACU Anesthesia Type: General Level of consciousness: awake and alert Pain management: pain level controlled Vital Signs Assessment: post-procedure vital signs reviewed and stable Respiratory status: spontaneous breathing, nonlabored ventilation, respiratory function stable and patient connected to nasal cannula oxygen Cardiovascular status: blood pressure returned to baseline and stable Postop Assessment: no signs of nausea or vomiting Anesthetic complications: no    Last Vitals:  Filed Vitals:   11/29/15 1036 11/29/15 1056  BP:  116/69  Pulse: 71 72  Temp:  36.4 C  Resp: 19 18    Last Pain:  Filed Vitals:   11/29/15 1058  PainSc: 0-No pain                 Catalina Gravel

## 2015-11-29 NOTE — Discharge Instructions (Signed)
[facility] °Office Phone Number [phone] ° °POST OP INSTRUCTIONS ° °Always review your discharge instruction sheet given to you by the facility where your surgery was performed. ° °IF YOU HAVE DISABILITY OR FAMILY LEAVE FORMS, YOU MUST BRING THEM TO THE OFFICE FOR PROCESSING.  DO NOT GIVE THEM TO YOUR DOCTOR. ° °1. A prescription for pain medication may be given to you upon discharge.  Take your pain medication as prescribed, if needed.  If narcotic pain medicine is not needed, then you may take acetaminophen (Tylenol), naprosyn (Alleve) or ibuprofen (Advil) as needed. °2. Take your usually prescribed medications unless otherwise directed °3. If you need a refill on your pain medication, please contact your pharmacy.  They will contact our office to request authorization.  Prescriptions will not be filled after 5pm or on week-ends. °4. You should eat very light the first 24 hours after surgery, such as soup, crackers, pudding, etc.  Resume your normal diet the day after surgery. °5. Most patients will experience some swelling and bruising in the breast.  Ice packs and a good support bra will help.  Wear the breast binder provided or a sports bra for 72 hours day and night.  After that wear a sports bra during the day until you return to the office. Swelling and bruising can take several days to resolve.  °6. It is common to experience some constipation if taking pain medication after surgery.  Increasing fluid intake and taking a stool softener will usually help or prevent this problem from occurring.  A mild laxative (Milk of Magnesia or Miralax) should be taken according to package directions if there are no bowel movements after 48 hours. °7. Unless discharge instructions indicate otherwise, you may remove your bandages 48 hours after surgery and you may shower at that time.  You may have steri-strips (small skin tapes) in place directly over the incision.  These strips should be left on the  skin for 7-10 days and will come off on their own.  If your surgeon used skin glue on the incision, you may shower in 24 hours.  The glue will flake off over the next 2-3 weeks.  Any sutures or staples will be removed at the office during your follow-up visit. °8. ACTIVITIES:  You may resume regular daily activities (gradually increasing) beginning the next day.  Wearing a good support bra or sports bra minimizes pain and swelling.  You may have sexual intercourse when it is comfortable. °a. You may drive when you no longer are taking prescription pain medication, you can comfortably wear a seatbelt, and you can safely maneuver your car and apply brakes. °b. RETURN TO WORK:  ______________________________________________________________________________________ °9. You should see your doctor in the office for a follow-up appointment approximately two weeks after your surgery.  Your doctor’s nurse will typically make your follow-up appointment when she calls you with your pathology report.  Expect your pathology report 3-4 business days after your surgery.  You may call to check if you do not hear from us after three days. °10. OTHER INSTRUCTIONS: _______________________________________________________________________________________________ _____________________________________________________________________________________________________________________________________ °_____________________________________________________________________________________________________________________________________ °_____________________________________________________________________________________________________________________________________ ° °WHEN TO CALL DR WAKEFIELD: °1. Fever over 101.0 °2. Nausea and/or vomiting. °3. Extreme swelling or bruising. °4. Continued bleeding from incision. °5. Increased pain, redness, or drainage from the incision. ° °The clinic staff is available to answer your questions during regular  business hours.  Please don’t hesitate to call and ask to speak to one of the nurses for clinical concerns.  If   you have a medical emergency, go to the nearest emergency room or call 911.  A surgeon from [facility] is always on call at the hospital. ° °For further questions, please visit [email or web address] ° ° ° °Post Anesthesia Home Care Instructions ° °Activity: °Get plenty of rest for the remainder of the day. A responsible adult should stay with you for 24 hours following the procedure.  °For the next 24 hours, DO NOT: °-Drive a car °-Operate machinery °-Drink alcoholic beverages °-Take any medication unless instructed by your physician °-Make any legal decisions or sign important papers. ° °Meals: °Start with liquid foods such as gelatin or soup. Progress to regular foods as tolerated. Avoid greasy, spicy, heavy foods. If nausea and/or vomiting occur, drink only clear liquids until the nausea and/or vomiting subsides. Call your physician if vomiting continues. ° °Special Instructions/Symptoms: °Your throat may feel dry or sore from the anesthesia or the breathing tube placed in your throat during surgery. If this causes discomfort, gargle with warm salt water. The discomfort should disappear within 24 hours. ° °If you had a scopolamine patch placed behind your ear for the management of post- operative nausea and/or vomiting: ° °1. The medication in the patch is effective for 72 hours, after which it should be removed.  Wrap patch in a tissue and discard in the trash. Wash hands thoroughly with soap and water. °2. You may remove the patch earlier than 72 hours if you experience unpleasant side effects which may include dry mouth, dizziness or visual disturbances. °3. Avoid touching the patch. Wash your hands with soap and water after contact with the patch. °  ° °

## 2015-11-29 NOTE — Anesthesia Preprocedure Evaluation (Addendum)
Anesthesia Evaluation  Patient identified by MRN, date of birth, ID band Patient awake    Reviewed: Allergy & Precautions, NPO status , Patient's Chart, lab work & pertinent test results  Airway Mallampati: II  TM Distance: >3 FB Neck ROM: Full    Dental  (+) Teeth Intact, Dental Advisory Given   Pulmonary Current Smoker,    Pulmonary exam normal breath sounds clear to auscultation       Cardiovascular negative cardio ROS Normal cardiovascular exam Rhythm:Regular Rate:Normal     Neuro/Psych negative neurological ROS     GI/Hepatic negative GI ROS, Neg liver ROS,   Endo/Other  Obesity   Renal/GU negative Renal ROS     Musculoskeletal negative musculoskeletal ROS (+)   Abdominal   Peds  Hematology negative hematology ROS (+)   Anesthesia Other Findings Day of surgery medications reviewed with the patient.  Breast lump  Reproductive/Obstetrics                            Anesthesia Physical Anesthesia Plan  ASA: II  Anesthesia Plan: General   Post-op Pain Management:    Induction: Intravenous  Airway Management Planned: LMA  Additional Equipment:   Intra-op Plan:   Post-operative Plan: Extubation in OR  Informed Consent: I have reviewed the patients History and Physical, chart, labs and discussed the procedure including the risks, benefits and alternatives for the proposed anesthesia with the patient or authorized representative who has indicated his/her understanding and acceptance.   Dental advisory given  Plan Discussed with: CRNA  Anesthesia Plan Comments: (Risks/benefits of general anesthesia discussed with patient including risk of damage to teeth, lips, gum, and tongue, nausea/vomiting, allergic reactions to medications, and the possibility of heart attack, stroke and death.  All patient questions answered.  Patient wishes to proceed.)        Anesthesia Quick  Evaluation

## 2015-11-29 NOTE — Transfer of Care (Signed)
Immediate Anesthesia Transfer of Care Note  Patient: Cassandra Irwin  Procedure(s) Performed: Procedure(s): RADIOACTIVE SEED GUIDED EXCISIONAL LEFT BREAST BIOPSY (Left)  Patient Location: PACU  Anesthesia Type:General  Level of Consciousness: awake and patient cooperative  Airway & Oxygen Therapy: Patient Spontanous Breathing and Patient connected to face mask oxygen  Post-op Assessment: Report given to RN and Post -op Vital signs reviewed and stable  Post vital signs: Reviewed and stable  Last Vitals:  Filed Vitals:   11/29/15 0718  BP: 129/55  Pulse: 69  Temp: 36.5 C  Resp: 20    Last Pain: There were no vitals filed for this visit.       Complications: No apparent anesthesia complications

## 2015-11-29 NOTE — H&P (Signed)
Cassandra Irwin is an 65 y.o. female.   Chief Complaint: radial scar HPI:  40 yof who underwent routine screening mm that shows c density breasts. she has some right breast pain but otherwise no mass/no discharge. she has a 1.4 cm irregular asymmetry in the medial left breast. right is normal. she underwent targeted left breast US that shows a cluster of cysts at 9oclock 2 cm from nipple. this is similary to previous. left axilla is negative. the right breast had targeted US showing simple cyst present. she underwent affirm biopsy of the left breast that shows a csl. clip was placed. she is here with her husband Dr Aldona Bar today.   Past Medical History  Diagnosis Date  . Breast lump   . History of shingles     Past Surgical History  Procedure Laterality Date  . Rotator cuff repair Bilateral     x2  . Breast lumpectomy      x 2  . Tubal ligation      History reviewed. No pertinent family history. Social History:  reports that she has been smoking.  She has never used smokeless tobacco. She reports that she drinks alcohol. She reports that she does not use illicit drugs.  Allergies:  Allergies  Allergen Reactions  . Other Rash    Steri strips    Medications Prior to Admission  Medication Sig Dispense Refill  . atorvastatin (LIPITOR) 40 MG tablet Take 1 tablet (40 mg total) by mouth daily. 90 tablet 3  . estradiol (VIVELLE-DOT) 0.05 MG/24HR patch Place 1 patch onto the skin 2 (two) times a week.    . loratadine (CLARITIN) 10 MG tablet Take 10 mg by mouth daily.    . progesterone (PROMETRIUM) 100 MG capsule Take 100 mg by mouth daily. Take for 12 days every 3rd month      No results found for this or any previous visit (from the past 48 hour(s)). Mm Lt Radioactive Seed Loc Mammo Guide  11/28/2015  CLINICAL DATA:  Patient for preoperative localization prior to excisional biopsy. EXAM: MAMMOGRAPHIC GUIDED RADIOACTIVE SEED LOCALIZATION OF THE LEFT BREAST COMPARISON:   Previous exam(s). FINDINGS: Patient presents for radioactive seed localization prior to left breast excisional biopsy. I met with the patient and we discussed the procedure of seed localization including benefits and alternatives. We discussed the high likelihood of a successful procedure. We discussed the risks of the procedure including infection, bleeding, tissue injury and further surgery. We discussed the low dose of radioactivity involved in the procedure. Informed, written consent was given. The usual time-out protocol was performed immediately prior to the procedure. Using mammographic guidance, sterile technique, 1% lidocaine and an I-125 radioactive seed, biopsy marking clip within medial left breast was localized using a medial approach. The follow-up mammogram images confirm the seed in the expected location and were marked for Dr. Donne Hazel. Follow-up survey of the patient confirms presence of the radioactive seed. Order number of I-125 seed:  [id number]. Total activity:  7.341 millicuries  Reference Date: 11/17/2015 The patient tolerated the procedure well and was released from the [facility]. She was given instructions regarding seed removal. IMPRESSION: Radioactive seed localization left breast. No apparent complications. Electronically Signed   By: Lovey Newcomer M.D.   On: 11/28/2015 13:33    ROS  Review of Systems ([place]; 11/02/2015 9:43 AM) Skin Not Present- Change in Wart/Mole, Dryness, Hives, Jaundice, New Lesions, Non-Healing Wounds, Rash and Ulcer. HEENT Present- Seasonal Allergies. Not Present- Earache,  Hearing Loss, Hoarseness, Nose Bleed, Oral Ulcers, Ringing in the Ears, Sinus Pain, Sore Throat, Visual Disturbances, Wears glasses/contact lenses and Yellow Eyes. Respiratory Present- Snoring. Not Present- Bloody sputum, Chronic Cough, Difficulty Breathing and Wheezing. Cardiovascular Not Present- Chest Pain, Difficulty Breathing Lying Down, Leg Cramps, Palpitations, Rapid  Heart Rate, Shortness of Breath and Swelling of Extremities. Gastrointestinal Not Present- Abdominal Pain, Bloating, Bloody Stool, Change in Bowel Habits, Chronic diarrhea, Constipation, Difficulty Swallowing, Excessive gas, Gets full quickly at meals, Hemorrhoids, Indigestion, Nausea, Rectal Pain and Vomiting. Musculoskeletal Not Present- Back Pain, Joint Pain, Joint Stiffness, Muscle Pain, Muscle Weakness and Swelling of Extremities. Psychiatric Not Present- Anxiety, Bipolar, Change in Sleep Pattern, Depression, Fearful and Frequent crying. Endocrine Not Present- Cold Intolerance, Excessive Hunger, Hair Changes, Heat Intolerance, Hot flashes and New Diabetes.   Blood pressure 129/55, pulse 69, temperature 97.7 F (36.5 C), temperature source Oral, resp. rate 20, height _0  (1.676 m), weight 85.276 kg (188 lb), SpO2 97 %. Physical Exam    Physical Exam Rolm Bookbinder MD; 11/02/2015 10:25 AM)  Tonette Bihari (Sonya Bynum CMA; 11/02/2015 9:43 AM) 11/02/2015 9:43 AM Weight: 191 lb Height: 66in Body Surface Area: 1.96 m Body Mass Index: 30.83 kg/m  Temp.: 6F(Temporal)  Pulse: 76 (Regular)  BP: 124/74 (Sitting, Left Arm, Standard)  General Mental Status-Alert. Orientation-Oriented X3.  Breast Breast Lump-No Palpable Breast Mass. Note: multiple well healed scars   Lymphatic Head & Neck  General Head & Neck Lymphatics: Bilateral - Description - Normal. Axillary  General Axillary Region: Bilateral - Description - Normal. Note: no [place] adenopathy  Assessment/Plan  Assessment & Plan Rolm Bookbinder MD; 11/02/2015 10:26 AM) RADIAL SCAR OF BREAST (N64.89) Story: Left breast seed guided excisional biopsy we discussed options including six month observation vs surgery. we have elected to excise this area. we discussed seed guided excision of this area with risks/benefits. will do later this month/early June.  Rolm Bookbinder, MD 11/29/2015, 8:57 AM

## 2015-11-29 NOTE — Op Note (Signed)
Preoperative diagnoses:left breast mass with core biopsy c/w radial scar Postoperative diagnosis: Same as above Procedure:Left breast seed guided excisional biopsy Surgeon: Dr. Serita Grammes Anesthesia: Gen. Estimated blood loss: Minimal Complications: None Drains: None Specimens:left breast tissue marked with paint Sponge and needle count correct at completion Disposition to recovery stable  Indications: This is a 39 yof who has screening mm with left breast mass. She underwent core biopsy with finding of a radial scar. We discussed options and elected for seed guided excision. She had seed placed prior to surgery and I had her mm in the OR.   Procedure:After informed consent was obtained she was then taken to the operating room. She was given cefazolin. Sequential compression devices on her legs. She was placed under general anesthesia without complication. Her left breast was then prepped and draped in the standard sterile surgical fashion. A surgical timeout was then performed.   I located the radioactive seed with the neoprobe.I infiltrated marcaine in the periareolar area and then made a periareolar incision. I then used the neoprobe to guide the excision of the seed and surrounding tissue. The seed was sent separately as it came out of the cavity. . This was confirmed by the neoprobe. This was painted. This was then taken for mammogram which confirmed removal of the clip and the seed. This was confirmed by radiology. This was then sent to pathology. Hemostasis was observed. I closed the breast tissue with a 2-0 Vicryl. The dermis was closed with 3-0 Vicryl the skin with 5-0 Monocryl. Dermabond was placed. She was transferred to recovery stable.

## 2015-11-29 NOTE — Interval H&P Note (Signed)
History and Physical Interval Note:  11/29/2015 8:59 AM  Cassandra Irwin  has presented today for surgery, with the diagnosis of Left breast mass   The various methods of treatment have been discussed with the patient and family. After consideration of risks, benefits and other options for treatment, the patient has consented to  Procedure(s): RADIOACTIVE SEED GUIDED EXCISIONAL LEFT BREAST BIOPSY (Left) as a surgical intervention .  The patient's history has been reviewed, patient examined, no change in status, stable for surgery.  I have reviewed the patient's chart and labs.  Questions were answered to the patient's satisfaction.     Ioannis Schuh

## 2015-11-30 ENCOUNTER — Encounter (HOSPITAL_BASED_OUTPATIENT_CLINIC_OR_DEPARTMENT_OTHER): Payer: Self-pay | Admitting: General Surgery

## 2016-02-08 ENCOUNTER — Other Ambulatory Visit: Payer: Self-pay | Admitting: Gastroenterology

## 2016-02-08 ENCOUNTER — Other Ambulatory Visit: Payer: Self-pay | Admitting: Family Medicine

## 2016-02-08 DIAGNOSIS — E785 Hyperlipidemia, unspecified: Secondary | ICD-10-CM

## 2016-02-08 LAB — HM COLONOSCOPY

## 2016-02-21 ENCOUNTER — Other Ambulatory Visit: Payer: Self-pay | Admitting: Family Medicine

## 2016-02-21 ENCOUNTER — Encounter: Payer: Self-pay | Admitting: Family Medicine

## 2016-04-02 ENCOUNTER — Encounter: Payer: Self-pay | Admitting: Family Medicine

## 2016-04-02 ENCOUNTER — Ambulatory Visit (INDEPENDENT_AMBULATORY_CARE_PROVIDER_SITE_OTHER): Payer: BLUE CROSS/BLUE SHIELD | Admitting: Family Medicine

## 2016-04-02 VITALS — BP 116/72 | HR 80 | Ht 66.0 in | Wt 191.0 lb

## 2016-04-02 DIAGNOSIS — Z1159 Encounter for screening for other viral diseases: Secondary | ICD-10-CM

## 2016-04-02 DIAGNOSIS — E785 Hyperlipidemia, unspecified: Secondary | ICD-10-CM | POA: Diagnosis not present

## 2016-04-02 DIAGNOSIS — Z23 Encounter for immunization: Secondary | ICD-10-CM | POA: Diagnosis not present

## 2016-04-02 DIAGNOSIS — Z0000 Encounter for general adult medical examination without abnormal findings: Secondary | ICD-10-CM | POA: Diagnosis not present

## 2016-04-02 DIAGNOSIS — F17200 Nicotine dependence, unspecified, uncomplicated: Secondary | ICD-10-CM

## 2016-04-02 DIAGNOSIS — Z8249 Family history of ischemic heart disease and other diseases of the circulatory system: Secondary | ICD-10-CM | POA: Diagnosis not present

## 2016-04-02 LAB — LIPID PANEL
CHOL/HDL RATIO: 4 ratio (ref <=5.0)
CHOLESTEROL: 170 mg/dL (ref 125–200)
HDL: 43 mg/dL — ABNORMAL LOW (ref >=46)
LDL CALC: 88 mg/dL (ref <130)
Triglycerides: 197 mg/dL — ABNORMAL HIGH (ref <150)
VLDL: 39 mg/dL — AB (ref <30)

## 2016-04-02 LAB — COMPREHENSIVE METABOLIC PANEL
ALT: 12 U/L (ref 6–29)
AST: 15 U/L (ref 10–35)
Albumin: 4.3 g/dL (ref 3.6–5.1)
Alkaline Phosphatase: 63 U/L (ref 33–130)
BUN: 12 mg/dL (ref 7–25)
CHLORIDE: 106 mmol/L (ref 98–110)
CO2: 24 mmol/L (ref 20–31)
CREATININE: 0.67 mg/dL (ref 0.50–0.99)
Calcium: 9.2 mg/dL (ref 8.6–10.4)
GLUCOSE: 92 mg/dL (ref 65–99)
POTASSIUM: 4.4 mmol/L (ref 3.5–5.3)
SODIUM: 140 mmol/L (ref 135–146)
TOTAL PROTEIN: 6.6 g/dL (ref 6.1–8.1)
Total Bilirubin: 1.2 mg/dL (ref 0.2–1.2)

## 2016-04-02 LAB — CBC WITH DIFFERENTIAL/PLATELET
BASOS PCT: 1 %
Basophils Absolute: 55 cells/uL (ref 0–200)
EOS PCT: 2 %
Eosinophils Absolute: 110 cells/uL (ref 15–500)
HCT: 44.8 % (ref 35.0–45.0)
Hemoglobin: 15.4 g/dL (ref 11.7–15.5)
LYMPHS PCT: 23 %
Lymphs Abs: 1265 cells/uL (ref 850–3900)
MCH: 29.3 pg (ref 27.0–33.0)
MCHC: 34.4 g/dL (ref 32.0–36.0)
MCV: 85.3 fL (ref 80.0–100.0)
MONOS PCT: 8 %
MPV: 9.9 fL (ref 7.5–12.5)
Monocytes Absolute: 440 cells/uL (ref 200–950)
NEUTROS ABS: 3630 {cells}/uL (ref 1500–7800)
Neutrophils Relative %: 66 %
PLATELETS: 217 10*3/uL (ref 140–400)
RBC: 5.25 MIL/uL — ABNORMAL HIGH (ref 3.80–5.10)
RDW: 13.3 % (ref 11.0–15.0)
WBC: 5.5 10*3/uL (ref 4.0–10.5)

## 2016-04-02 MED ORDER — ATORVASTATIN CALCIUM 40 MG PO TABS: 40.0000 mg | ORAL_TABLET | Freq: Every day | ORAL | 3 refills | Status: DC

## 2016-04-02 NOTE — Patient Instructions (Addendum)
150 minutes a week of something physical Time to quit smoking. Give yourself something else to do instead of smoking.

## 2016-04-02 NOTE — Progress Notes (Signed)
   Subjective:    Patient ID: Cassandra Irwin, female    DOB: [birth year], 65 y.o.   MRN: [id number]  HPI She is here for complete examination. She was seen recently by her gynecologist. Apparently nothing unusual was found. She has had a all anoscopy this year. He was a family history of heart disease and presently she is taking Lipitor. She does smoke intermittently and has tried to quit in the past. She does recognize that this is mostly a habit related issue. Her marriage is going well. Family and social history as well as health maintenance and immunizations were reviewed. She does not complain of chest pain, shortness breath, allergy related symptoms. Her exercise is quite minimal.  Review of Systems  All other systems reviewed and are negative.      Objective:   Physical Exam BP 116/72   Pulse 80   Ht 5\' 6"  (1.676 m)   Wt 191 lb (86.6 kg)   BMI 30.83 kg/m   General Appearance:    Alert, cooperative, no distress, appears stated age  Head:    Normocephalic, without obvious abnormality, atraumatic  Eyes:    PERRL, conjunctiva/corneas clear, EOM's intact, fundi    benign  Ears:    Normal TM's and external ear canals  Nose:   Nares normal, mucosa normal, no drainage or sinus   tenderness  Throat:   Lips, mucosa, and tongue normal; teeth and gums normal  Neck:   Supple, no lymphadenopathy;  thyroid:  no   enlargement/tenderness/nodules; no carotid   bruit or JVD     Lungs:     Clear to auscultation bilaterally without wheezes, rales or     ronchi; respirations unlabored      Heart:    Regular rate and rhythm, S1 and S2 normal, no murmur, rub   or gallop  Breast Exam:    Deferred to GYN  Abdomen:     Soft, non-tender, nondistended, normoactive bowel sounds,    no masses, no hepatosplenomegaly  Genitalia:    Deferred to GYN     Extremities:   No clubbing, cyanosis or edema  Pulses:   2+ and symmetric all extremities  Skin:   Skin color, texture, turgor normal, no rashes or  lesions  Lymph nodes:   Cervical, supraclavicular, and axillary nodes normal  Neurologic:   CNII-XII intact, normal strength, sensation and gait; reflexes 2+ and symmetric throughout          Psych:   Normal mood, affect, hygiene and grooming.          Assessment & Plan:  Routine general medical examination at a health care facility - Plan: CBC with Differential/Platelet, Comprehensive metabolic panel, Lipid panel  Need for prophylactic vaccination and inoculation against influenza - Plan: Flu Vaccine QUAD 36+ mos PF IM (Fluarix & Fluzone Quad PF), Tdap vaccine greater than or equal to 7yo IM  Current smoker  Family history of heart disease - Plan: CBC with Differential/Platelet, Comprehensive metabolic panel, Lipid panel  Hyperlipidemia LDL goal <70 - Plan: Lipid panel, atorvastatin (LIPITOR) 40 MG tablet  Need for prophylactic vaccination with combined diphtheria-tetanus-pertussis (DTP) vaccine  Need for hepatitis C screening test - Plan: Hepatitis C antibody [place] to increase her physical activities by 150 minutes a week of something physical. Discussed smoking cessation with her in regard to habit. Recommended that she look more diligently active at her habits and give yourself something positive to do during those timeframes. Return here as needed.

## 2016-04-03 LAB — HEPATITIS C ANTIBODY: HCV AB: NEGATIVE

## 2017-01-14 ENCOUNTER — Other Ambulatory Visit: Payer: Self-pay | Admitting: Obstetrics & Gynecology

## 2017-01-14 DIAGNOSIS — Z1231 Encounter for screening mammogram for malignant neoplasm of breast: Secondary | ICD-10-CM

## 2017-02-04 ENCOUNTER — Ambulatory Visit
Admission: RE | Admit: 2017-02-04 | Discharge: 2017-02-04 | Disposition: A | Discharge status: Home / Self Care | Payer: BLUE CROSS/BLUE SHIELD | Source: Ambulatory Visit | Attending: Obstetrics & Gynecology | Admitting: Obstetrics & Gynecology

## 2017-02-04 DIAGNOSIS — Z1231 Encounter for screening mammogram for malignant neoplasm of breast: Secondary | ICD-10-CM

## 2017-05-19 ENCOUNTER — Other Ambulatory Visit: Payer: Self-pay | Admitting: Family Medicine

## 2017-05-19 DIAGNOSIS — E785 Hyperlipidemia, unspecified: Secondary | ICD-10-CM

## 2017-05-19 NOTE — Telephone Encounter (Signed)
Pt has appt in 07/2017- will send 90 days to last until appt. Cassandra Irwin December

## 2017-07-07 ENCOUNTER — Encounter: Payer: BLUE CROSS/BLUE SHIELD | Admitting: Family Medicine

## 2017-08-22 ENCOUNTER — Ambulatory Visit (INDEPENDENT_AMBULATORY_CARE_PROVIDER_SITE_OTHER): Payer: Medicare Other | Admitting: Family Medicine

## 2017-08-22 ENCOUNTER — Encounter: Payer: Self-pay | Admitting: Family Medicine

## 2017-08-22 VITALS — BP 120/82 | HR 73 | Ht 65.0 in | Wt 191.8 lb

## 2017-08-22 DIAGNOSIS — J309 Allergic rhinitis, unspecified: Secondary | ICD-10-CM

## 2017-08-22 DIAGNOSIS — F17200 Nicotine dependence, unspecified, uncomplicated: Secondary | ICD-10-CM | POA: Diagnosis not present

## 2017-08-22 DIAGNOSIS — Z8249 Family history of ischemic heart disease and other diseases of the circulatory system: Secondary | ICD-10-CM | POA: Diagnosis not present

## 2017-08-22 DIAGNOSIS — Z0000 Encounter for general adult medical examination without abnormal findings: Secondary | ICD-10-CM | POA: Diagnosis not present

## 2017-08-22 DIAGNOSIS — Z23 Encounter for immunization: Secondary | ICD-10-CM

## 2017-08-22 DIAGNOSIS — N951 Menopausal and female climacteric states: Secondary | ICD-10-CM

## 2017-08-22 DIAGNOSIS — E785 Hyperlipidemia, unspecified: Secondary | ICD-10-CM | POA: Diagnosis not present

## 2017-08-22 LAB — POCT URINALYSIS DIP (PROADVANTAGE DEVICE)
Bilirubin, UA: NEGATIVE
Glucose, UA: NEGATIVE mg/dL
Ketones, POC UA: NEGATIVE mg/dL
Leukocytes, UA: NEGATIVE
NITRITE UA: NEGATIVE
PH UA: 6.5 (ref 5.0–8.0)
Protein Ur, POC: NEGATIVE mg/dL
RBC UA: NEGATIVE
SPECIFIC GRAVITY, URINE: 1.01
UUROB: 3.5

## 2017-08-22 MED ORDER — ATORVASTATIN CALCIUM 40 MG PO TABS: 40.0000 mg | ORAL_TABLET | Freq: Every day | ORAL | 3 refills | Status: DC

## 2017-08-22 NOTE — Progress Notes (Signed)
Cassandra Irwin is a 67 y.o. female who presents for annual wellness visit and follow-up on chronic medical conditions.  She has the following concerns: She continues on Lipitor for her underlying hyperlipidemia as well as having a family history of heart disease.  She sees her gynecologist regularly and presently is on Vivelle as well as progesterone for treatment of her hot flashes.  She does use Claritin for her allergies and they are usually mainly in the spring.  She does smoke and in the past had tried Chantix.  At this time she states that she is not ready to quit smoking.  She is now on Medicare.  Her marriage is going well.  Immunizations and Health Maintenance Immunization History  Administered Date(s) Administered  . Influenza,inj,Quad PF,6+ Mos 03/14/2015, 04/02/2016  . Tdap 04/02/2016   Health Maintenance Due  Topic Date Due  . HIV Screening  08/17/1967  . PAP SMEAR  08/16/1973  . INFLUENZA VACCINE  01/29/2017  . DEXA SCAN  08/16/2017  . PNA vac Low Risk Adult (1 of 2 - PCV13) 08/16/2017    Last Pap smear: oct 2018 Last mammogram: June 2018 Last colonoscopy: last year Last DEXA: five years ago WNL Dentist: feb  2019 Ophtho: aug or July last yearl Exercise: no  Other doctors caring for patient include: Cassandra Irwin   Advanced directives:yes copy asked for    Depression screen:  See questionnaire below.  Depression screen [facility] 2/9 08/22/2017 04/02/2016  Decreased Interest 0 0  Down, Depressed, Hopeless - 0  PHQ - 2 Score 0 0    Fall Risk Screen: see questionnaire below. Fall Risk  08/22/2017 04/02/2016  Falls in the past year? No No    ADL screen:  See questionnaire below Functional Status Survey:   Review of Systems Constitutional: -, -unexpected weight change, -anorexia, -fatigue Allergy: -sneezing, -itching, -congestion Dermatology: denies changing moles, rash, lumps ENT: -runny nose, -ear pain, -sore throat,  Cardiology:  -chest pain, -palpitations,  -orthopnea, Respiratory: -cough, -shortness of breath, -dyspnea on exertion, -wheezing,  Gastroenterology: -abdominal pain, -nausea, -vomiting, -diarrhea, -constipation, -dysphagia Hematology: -bleeding or bruising problems Musculoskeletal: -arthralgias, -myalgias, -joint swelling, -back pain, - Ophthalmology: -vision changes,  Urology: -dysuria, -difficulty urinating,  -urinary frequency, -urgency, incontinence Neurology: -, -numbness, , -memory loss, -falls, -dizziness    PHYSICAL EXAM:   General Appearance: Alert, cooperative, no distress, appears stated age Head: Normocephalic, without obvious abnormality, atraumatic Eyes: PERRL, conjunctiva/corneas clear, EOM's intact, fundi benign Ears: Normal TM's and external ear canals Nose: Nares normal, mucosa normal, no drainage or sinus tenderness Throat: Lips, mucosa, and tongue normal; teeth and gums normal Neck: Supple, no lymphadenopathy;  thyroid:  no enlargement/tenderness/nodules; no carotid bruit or JVD Lungs: Clear to auscultation bilaterally without wheezes, rales or ronchi; respirations unlabored Heart: Regular rate and rhythm, S1 and S2 normal, no murmur, rubor gallop Extremities: No clubbing, cyanosis or edema Pulses: 2+ and symmetric all extremities Skin:  Skin color, texture, turgor normal, no rashes or lesions Lymph nodes: Cervical, supraclavicular, and axillary nodes normal Neurologic:  CNII-XII intact, normal strength, sensation and gait; reflexes 2+ and symmetric throughout Psych: Normal mood, affect, hygiene and grooming.  ASSESSMENT/PLAN: Need for pneumococcal vaccination - Plan: Pneumococcal polysaccharide vaccine 23-valent greater than or equal to 2yo subcutaneous/IM  Current smoker  Family history of heart disease - Plan: CBC with Differential/Platelet, Comprehensive metabolic panel, Lipid panel  Hyperlipidemia LDL goal <70 - Plan: Lipid panel, atorvastatin (LIPITOR) 40 MG tablet  Hot flashes due to  menopause  Allergic rhinitis, unspecified seasonality, unspecified trigger - Plan: CBC with Differential/Platelet, Comprehensive metabolic panel     Discussed at least 30 minutes of aerobic activity at least 5 days/week and weight-bearing exercise 2x/week; healthy diet, including goals of calcium and vitamin D intake and alcohol recommendations (less than or equal to 1 drink/day) reviewed; regular seatbelt use; Immunization recommendations discussed.  Colonoscopy recommendations reviewed Discussed smoking cessation with her.  Discussed recognizing the difference between habit and true addiction. I also encouraged her to discuss stopping HRT with her gynecologist.  Medicare Attestation I have personally reviewed: The patient's medical and social history Their use of alcohol, tobacco or illicit drugs Their current medications and supplements The patient's functional ability including ADLs,fall risks, home safety risks, cognitive, and hearing and visual impairment Diet and physical activities Evidence for depression or mood disorders  The patient's weight, height, and BMI have been recorded in the chart.  I have made referrals, counseling, and provided education to the patient based on review of the above and I have provided the patient with a written personalized care plan for preventive services.     Jill Alexanders, MD   08/22/2017

## 2017-08-22 NOTE — Patient Instructions (Signed)
  Cassandra Irwin , Thank you for taking time to come for your Medicare Wellness Visit. I appreciate your ongoing commitment to your health goals. Please review the following plan we discussed and let me know if I can assist you in the future.   These are the goals we discussed: Increased physical activity to any meds every day or 150 minutes a week.   This is a list of the screening recommended for you and due dates:  Health Maintenance  Topic Date Due  . HIV Screening  08/17/1967  . Pap Smear  08/16/1973  . Flu Shot  01/29/2017  . DEXA scan (bone density measurement)  08/16/2017  . Pneumonia vaccines (1 of 2 - PCV13) 08/16/2017  . Mammogram  02/05/2019  . Colon Cancer Screening  02/07/2026  . Tetanus Vaccine  04/02/2026  .  Hepatitis C: One time screening is recommended by Center for Disease Control  (CDC) for  adults born from [birth year] through 1963.   Completed

## 2017-08-23 LAB — CBC WITH DIFFERENTIAL/PLATELET
BASOS: 0 %
Basophils Absolute: 0 10*3/uL (ref 0.0–0.2)
EOS (ABSOLUTE): 0.1 10*3/uL (ref 0.0–0.4)
EOS: 2 %
HEMATOCRIT: 45.2 % (ref 34.0–46.6)
Hemoglobin: 15.6 g/dL (ref 11.1–15.9)
IMMATURE GRANULOCYTES: 0 %
Immature Grans (Abs): 0 10*3/uL (ref 0.0–0.1)
Lymphocytes Absolute: 1 10*3/uL (ref 0.7–3.1)
Lymphs: 16 %
MCH: 30.1 pg (ref 26.6–33.0)
MCHC: 34.5 g/dL (ref 31.5–35.7)
MCV: 87 fL (ref 79–97)
MONOS ABS: 0.4 10*3/uL (ref 0.1–0.9)
Monocytes: 7 %
NEUTROS PCT: 75 %
Neutrophils Absolute: 4.6 10*3/uL (ref 1.4–7.0)
Platelets: 211 10*3/uL (ref 150–379)
RBC: 5.19 x10E6/uL (ref 3.77–5.28)
RDW: 13.1 % (ref 12.3–15.4)
WBC: 6.1 10*3/uL (ref 3.4–10.8)

## 2017-08-23 LAB — LIPID PANEL
CHOL/HDL RATIO: 3.6 ratio (ref 0.0–4.4)
Cholesterol, Total: 174 mg/dL (ref 100–199)
HDL: 48 mg/dL (ref >39)
LDL Calculated: 88 mg/dL (ref 0–99)
Triglycerides: 191 mg/dL — ABNORMAL HIGH (ref 0–149)
VLDL Cholesterol Cal: 38 mg/dL (ref 5–40)

## 2017-08-23 LAB — COMPREHENSIVE METABOLIC PANEL
A/G RATIO: 2 (ref 1.2–2.2)
ALBUMIN: 4.5 g/dL (ref 3.6–4.8)
ALT: 14 IU/L (ref 0–32)
AST: 16 IU/L (ref 0–40)
Alkaline Phosphatase: 72 IU/L (ref 39–117)
BUN / CREAT RATIO: 17 (ref 12–28)
BUN: 13 mg/dL (ref 8–27)
Bilirubin Total: 1.3 mg/dL — ABNORMAL HIGH (ref 0.0–1.2)
CALCIUM: 9.3 mg/dL (ref 8.7–10.3)
CO2: 23 mmol/L (ref 20–29)
Chloride: 102 mmol/L (ref 96–106)
Creatinine, Ser: 0.75 mg/dL (ref 0.57–1.00)
GFR calc Af Amer: 97 mL/min/{1.73_m2} (ref >59)
GFR, EST NON AFRICAN AMERICAN: 84 mL/min/{1.73_m2} (ref >59)
GLOBULIN, TOTAL: 2.3 g/dL (ref 1.5–4.5)
Glucose: 93 mg/dL (ref 65–99)
POTASSIUM: 4.7 mmol/L (ref 3.5–5.2)
Sodium: 140 mmol/L (ref 134–144)
Total Protein: 6.8 g/dL (ref 6.0–8.5)

## 2017-11-10 DIAGNOSIS — N840 Polyp of corpus uteri: Secondary | ICD-10-CM | POA: Diagnosis not present

## 2017-11-10 DIAGNOSIS — N950 Postmenopausal bleeding: Secondary | ICD-10-CM | POA: Diagnosis not present

## 2017-11-25 ENCOUNTER — Other Ambulatory Visit: Payer: Self-pay | Admitting: Obstetrics & Gynecology

## 2017-11-25 DIAGNOSIS — N840 Polyp of corpus uteri: Secondary | ICD-10-CM | POA: Diagnosis not present

## 2017-11-25 DIAGNOSIS — N938 Other specified abnormal uterine and vaginal bleeding: Secondary | ICD-10-CM | POA: Diagnosis not present

## 2017-11-25 DIAGNOSIS — N950 Postmenopausal bleeding: Secondary | ICD-10-CM | POA: Diagnosis not present

## 2018-02-02 ENCOUNTER — Other Ambulatory Visit: Payer: Self-pay | Admitting: Obstetrics & Gynecology

## 2018-02-02 DIAGNOSIS — Z1231 Encounter for screening mammogram for malignant neoplasm of breast: Secondary | ICD-10-CM

## 2018-02-19 ENCOUNTER — Ambulatory Visit
Admission: RE | Admit: 2018-02-19 | Discharge: 2018-02-19 | Disposition: A | Discharge status: Home / Self Care | Payer: Medicare Other | Source: Ambulatory Visit | Attending: Obstetrics & Gynecology | Admitting: Obstetrics & Gynecology

## 2018-02-19 DIAGNOSIS — Z1231 Encounter for screening mammogram for malignant neoplasm of breast: Secondary | ICD-10-CM | POA: Diagnosis not present

## 2018-02-25 DIAGNOSIS — H2513 Age-related nuclear cataract, bilateral: Secondary | ICD-10-CM | POA: Diagnosis not present

## 2018-04-09 DIAGNOSIS — Z124 Encounter for screening for malignant neoplasm of cervix: Secondary | ICD-10-CM | POA: Diagnosis not present

## 2018-04-09 DIAGNOSIS — Z23 Encounter for immunization: Secondary | ICD-10-CM | POA: Diagnosis not present

## 2018-04-09 DIAGNOSIS — Z6830 Body mass index (BMI) 30.0-30.9, adult: Secondary | ICD-10-CM | POA: Diagnosis not present

## 2018-08-25 ENCOUNTER — Ambulatory Visit (INDEPENDENT_AMBULATORY_CARE_PROVIDER_SITE_OTHER): Payer: Medicare Other | Admitting: Family Medicine

## 2018-08-25 ENCOUNTER — Encounter: Payer: Self-pay | Admitting: Family Medicine

## 2018-08-25 VITALS — BP 126/84 | HR 74 | Temp 97.9°F | Ht 65.0 in | Wt 189.8 lb

## 2018-08-25 DIAGNOSIS — F17200 Nicotine dependence, unspecified, uncomplicated: Secondary | ICD-10-CM | POA: Diagnosis not present

## 2018-08-25 DIAGNOSIS — E785 Hyperlipidemia, unspecified: Secondary | ICD-10-CM

## 2018-08-25 DIAGNOSIS — Z8249 Family history of ischemic heart disease and other diseases of the circulatory system: Secondary | ICD-10-CM

## 2018-08-25 DIAGNOSIS — N951 Menopausal and female climacteric states: Secondary | ICD-10-CM | POA: Diagnosis not present

## 2018-08-25 MED ORDER — ATORVASTATIN CALCIUM 40 MG PO TABS: 40.0000 mg | ORAL_TABLET | Freq: Every day | ORAL | 3 refills | Status: DC

## 2018-08-25 NOTE — Patient Instructions (Signed)
  Cassandra Irwin , Thank you for taking time to come for your Medicare Wellness Visit. I appreciate your ongoing commitment to your health goals. Please review the following plan we discussed and let me know if I can assist you in the future.   These are the goals we discussed: Goals   None     This is a list of the screening recommended for you and due dates:  Health Maintenance  Topic Date Due  . DEXA scan (bone density measurement)  08/16/2017  . Mammogram  02/20/2020  . Colon Cancer Screening  02/07/2026  . Tetanus Vaccine  04/02/2026  . Flu Shot  Completed  .  Hepatitis C: One time screening is recommended by Center for Disease Control  (CDC) for  adults born from [birth year] through 1963.   Completed  . Pneumonia vaccines  Completed

## 2018-08-25 NOTE — Progress Notes (Signed)
Cassandra Irwin is a 68 y.o. female who presents for annual wellness visit and follow-up on chronic medical conditions.  She continues on Lipitor.  She does have a family history of heart disease.  She continues to smoke and is not interested in quitting.  She is followed by Dr. Nori Riis for her postmenopausal hot flashes.  She has had a mammogram done recently and plans to get them yearly.  She also states she has had a DEXA scan done which was negative.  Her marriage is going well.  She has no other concerns or complaints.  Immunizations and Health Maintenance Immunization History  Administered Date(s) Administered  . Influenza,inj,Quad PF,6+ Mos 03/14/2015, 04/02/2016  . Pneumococcal Conjugate-13 04/02/2016  . Pneumococcal Polysaccharide-23 08/22/2017  . Tdap 04/02/2016   Health Maintenance Due  Topic Date Due  . DEXA SCAN  08/16/2017    Last Pap smear:03/2018 Last mammogram:01/2018 Last colonoscopy:02/08/16 Last DEXA: unknown Dentist:2/20 Ophtho:11/2017 Exercise: walking   Other doctors caring for patient include: Dr. Nori Riis obgyn,  Dr. Oletta Lamas GI, Dr. Thom Chimes eye,  Advanced directives:yes copy asked for    Depression screen:  See questionnaire below.  Depression screen [facility] 2/9 08/25/2018 08/22/2017 04/02/2016  Decreased Interest 0 0 0  Down, Depressed, Hopeless 0 - 0  PHQ - 2 Score 0 0 0    Fall Risk Screen: see questionnaire below. Fall Risk  08/25/2018 08/22/2017 04/02/2016  Falls in the past year? 0 No No    ADL screen:  See questionnaire below Functional Status Survey: Is the patient deaf or have difficulty hearing?: No Does the patient have difficulty seeing, even when wearing glasses/contacts?: No Does the patient have difficulty concentrating, remembering, or making decisions?: No Does the patient have difficulty walking or climbing stairs?: No Does the patient have difficulty dressing or bathing?: No Does the patient have difficulty doing errands alone such as visiting a  doctor's office or shopping?: No   Review of Systems Constitutional: -, -unexpected weight change, -anorexia, -fatigue Allergy: -sneezing, -itching, -congestion Dermatology: denies changing moles, rash, lumps ENT: -runny nose, -ear pain, -sore throat,  Cardiology:  -chest pain, -palpitations, -orthopnea, Respiratory: -cough, -shortness of breath, -dyspnea on exertion, -wheezing,  Gastroenterology: -abdominal pain, -nausea, -vomiting, -diarrhea, -constipation, -dysphagia Hematology: -bleeding or bruising problems Musculoskeletal: -arthralgias, -myalgias, -joint swelling, -back pain, - Ophthalmology: -vision changes,  Urology: -dysuria, -difficulty urinating,  -urinary frequency, -urgency, incontinence Neurology: -, -numbness, , -memory loss, -falls, -dizziness    PHYSICAL EXAM:   General Appearance: Alert, cooperative, no distress, appears stated age Head: Normocephalic, without obvious abnormality, atraumatic Eyes: PERRL, conjunctiva/corneas clear, EOM's intact, fundi benign Ears: Normal TM's and external ear canals Nose: Nares normal, mucosa normal, no drainage or sinus tenderness Throat: Lips, mucosa, and tongue normal; teeth and gums normal Neck: Supple, no lymphadenopathy;  thyroid:  no enlargement/tenderness/nodules; no carotid bruit or JVD Lungs: Clear to auscultation bilaterally without wheezes, rales or ronchi; respirations unlabored Heart: Regular rate and rhythm, S1 and S2 normal, no murmur, rubor gallop Abdomen: Soft, non-tender, nondistended, normoactive bowel sounds,  no masses, no hepatosplenomegaly Extremities: No clubbing, cyanosis or edema Pulses: 2+ and symmetric all extremities Skin:  Skin color, texture, turgor normal, no rashes or lesions Lymph nodes: Cervical, supraclavicular, and axillary nodes normal Neurologic:  CNII-XII intact, normal strength, sensation and gait; reflexes 2+ and symmetric throughout Psych: Normal mood, affect, hygiene and  grooming.  ASSESSMENT/PLAN: Hyperlipidemia LDL goal <70 - Plan: CBC with Differential/Platelet, Comprehensive metabolic panel, Lipid panel  Current smoker - Plan:  CBC with Differential/Platelet, Comprehensive metabolic panel, Lipid panel  Hot flashes due to menopause - Plan: CBC with Differential/Platelet, Comprehensive metabolic panel, Lipid panel  Family history of heart disease - Plan: CBC with Differential/Platelet, Comprehensive metabolic panel, Lipid panel I again encouraged her to quit smoking. Discussed  yearly mammograms;  Immunization recommendations discussed.  Colonoscopy recommendations reviewed   Medicare Attestation I have personally reviewed: The patient's medical and social history Their use of alcohol, tobacco or illicit drugs Their current medications and supplements The patient's functional ability including ADLs,fall risks, home safety risks, cognitive, and hearing and visual impairment Diet and physical activities Evidence for depression or mood disorders  The patient's weight, height, and BMI have been recorded in the chart.  I have made referrals, counseling, and provided education to the patient based on review of the above and I have provided the patient with a written personalized care plan for preventive services.     Jill Alexanders, MD   08/25/2018

## 2018-08-26 LAB — CBC WITH DIFFERENTIAL/PLATELET
BASOS ABS: 0.1 10*3/uL (ref 0.0–0.2)
BASOS: 1 %
EOS (ABSOLUTE): 0.1 10*3/uL (ref 0.0–0.4)
Eos: 1 %
Hematocrit: 46.9 % — ABNORMAL HIGH (ref 34.0–46.6)
Hemoglobin: 15.7 g/dL (ref 11.1–15.9)
Immature Grans (Abs): 0 10*3/uL (ref 0.0–0.1)
Immature Granulocytes: 0 %
Lymphocytes Absolute: 1.2 10*3/uL (ref 0.7–3.1)
Lymphs: 19 %
MCH: 28.6 pg (ref 26.6–33.0)
MCHC: 33.5 g/dL (ref 31.5–35.7)
MCV: 86 fL (ref 79–97)
MONOS ABS: 0.4 10*3/uL (ref 0.1–0.9)
Monocytes: 6 %
NEUTROS ABS: 4.5 10*3/uL (ref 1.4–7.0)
NEUTROS PCT: 73 %
PLATELETS: 250 10*3/uL (ref 150–450)
RBC: 5.48 x10E6/uL — ABNORMAL HIGH (ref 3.77–5.28)
RDW: 12.5 % (ref 11.7–15.4)
WBC: 6.3 10*3/uL (ref 3.4–10.8)

## 2018-08-26 LAB — LIPID PANEL
CHOLESTEROL TOTAL: 185 mg/dL (ref 100–199)
Chol/HDL Ratio: 3.8 ratio (ref 0.0–4.4)
HDL: 49 mg/dL (ref >39)
LDL Calculated: 94 mg/dL (ref 0–99)
TRIGLYCERIDES: 210 mg/dL — AB (ref 0–149)
VLDL Cholesterol Cal: 42 mg/dL — ABNORMAL HIGH (ref 5–40)

## 2018-08-26 LAB — COMPREHENSIVE METABOLIC PANEL
A/G RATIO: 1.9 (ref 1.2–2.2)
ALT: 16 IU/L (ref 0–32)
AST: 17 IU/L (ref 0–40)
Albumin: 4.6 g/dL (ref 3.8–4.8)
Alkaline Phosphatase: 74 IU/L (ref 39–117)
BILIRUBIN TOTAL: 1.2 mg/dL (ref 0.0–1.2)
BUN/Creatinine Ratio: 18 (ref 12–28)
BUN: 13 mg/dL (ref 8–27)
CHLORIDE: 100 mmol/L (ref 96–106)
CO2: 25 mmol/L (ref 20–29)
Calcium: 9.4 mg/dL (ref 8.7–10.3)
Creatinine, Ser: 0.74 mg/dL (ref 0.57–1.00)
GFR calc non Af Amer: 85 mL/min/{1.73_m2} (ref >59)
GFR, EST AFRICAN AMERICAN: 98 mL/min/{1.73_m2} (ref >59)
Globulin, Total: 2.4 g/dL (ref 1.5–4.5)
Glucose: 84 mg/dL (ref 65–99)
POTASSIUM: 4.5 mmol/L (ref 3.5–5.2)
Sodium: 140 mmol/L (ref 134–144)
Total Protein: 7 g/dL (ref 6.0–8.5)

## 2019-03-02 ENCOUNTER — Other Ambulatory Visit: Payer: Self-pay | Admitting: Obstetrics & Gynecology

## 2019-03-02 DIAGNOSIS — Z1231 Encounter for screening mammogram for malignant neoplasm of breast: Secondary | ICD-10-CM

## 2019-04-02 DIAGNOSIS — Z23 Encounter for immunization: Secondary | ICD-10-CM | POA: Diagnosis not present

## 2019-04-15 DIAGNOSIS — Z6831 Body mass index (BMI) 31.0-31.9, adult: Secondary | ICD-10-CM | POA: Diagnosis not present

## 2019-04-15 DIAGNOSIS — Z01419 Encounter for gynecological examination (general) (routine) without abnormal findings: Secondary | ICD-10-CM | POA: Diagnosis not present

## 2019-04-16 ENCOUNTER — Other Ambulatory Visit: Payer: Self-pay

## 2019-04-16 ENCOUNTER — Ambulatory Visit
Admission: RE | Admit: 2019-04-16 | Discharge: 2019-04-16 | Disposition: A | Discharge status: Home / Self Care | Payer: Medicare Other | Source: Ambulatory Visit | Attending: Obstetrics & Gynecology | Admitting: Obstetrics & Gynecology

## 2019-04-16 DIAGNOSIS — Z1231 Encounter for screening mammogram for malignant neoplasm of breast: Secondary | ICD-10-CM

## 2019-07-21 ENCOUNTER — Ambulatory Visit: Payer: Medicare Other | Attending: Internal Medicine

## 2019-07-21 DIAGNOSIS — Z23 Encounter for immunization: Secondary | ICD-10-CM | POA: Diagnosis not present

## 2019-07-21 NOTE — Progress Notes (Signed)
   Covid-19 Vaccination Clinic  Name:  ROSALINDA ROMANSKY    MRN: [id number] DOB: 27-Sep-1950  07/21/2019  Ms. Hadsall was observed post Covid-19 immunization for 15 minutes without incidence. She was provided with Vaccine Information Sheet and instruction to access the V-Safe system.   Ms. Robitaille was instructed to call 911 with any severe reactions post vaccine: [place] Kitchen Difficulty breathing  . Swelling of your face and throat  . A fast heartbeat  . A bad rash all over your body  . Dizziness and weakness    Immunizations Administered    Name Date Dose VIS Date Route   Pfizer COVID-19 Vaccine 07/21/2019  6:49 PM 0.3 mL 06/11/2019 Intramuscular   Manufacturer: [place]   Lot: [id number]   [place]: [id number]

## 2019-08-05 ENCOUNTER — Ambulatory Visit: Payer: Medicare Other

## 2019-08-10 ENCOUNTER — Ambulatory Visit: Payer: Medicare Other | Attending: Internal Medicine

## 2019-08-10 DIAGNOSIS — Z23 Encounter for immunization: Secondary | ICD-10-CM | POA: Insufficient documentation

## 2019-08-10 NOTE — Progress Notes (Signed)
   Covid-19 Vaccination Clinic  Name:  Cassandra Irwin    MRN: [id number] DOB: 10-08-1950  08/10/2019  Ms. Cassandra Irwin was observed post Covid-19 immunization for 15 minutes without incidence. She was provided with Vaccine Information Sheet and instruction to access the V-Safe system.   Ms. Cassandra Irwin was instructed to call 911 with any severe reactions post vaccine: [place] Kitchen Difficulty breathing  . Swelling of your face and throat  . A fast heartbeat  . A bad rash all over your body  . Dizziness and weakness    Immunizations Administered    Name Date Dose VIS Date Route   Pfizer COVID-19 Vaccine 08/10/2019  9:03 AM 0.3 mL 06/11/2019 Intramuscular   Manufacturer: [place]   Lot: [id number]   [place]: [id number]

## 2019-08-24 ENCOUNTER — Ambulatory Visit: Payer: Medicare Other

## 2019-08-27 ENCOUNTER — Ambulatory Visit: Payer: Medicare Other | Admitting: Family Medicine

## 2019-08-31 ENCOUNTER — Other Ambulatory Visit: Payer: Self-pay

## 2019-08-31 ENCOUNTER — Ambulatory Visit (INDEPENDENT_AMBULATORY_CARE_PROVIDER_SITE_OTHER): Payer: Medicare Other | Admitting: Family Medicine

## 2019-08-31 ENCOUNTER — Encounter: Payer: Self-pay | Admitting: Family Medicine

## 2019-08-31 VITALS — BP 138/82 | HR 80 | Temp 97.7°F | Ht 65.5 in | Wt 191.6 lb

## 2019-08-31 DIAGNOSIS — E785 Hyperlipidemia, unspecified: Secondary | ICD-10-CM

## 2019-08-31 DIAGNOSIS — N951 Menopausal and female climacteric states: Secondary | ICD-10-CM | POA: Diagnosis not present

## 2019-08-31 DIAGNOSIS — F17200 Nicotine dependence, unspecified, uncomplicated: Secondary | ICD-10-CM | POA: Diagnosis not present

## 2019-08-31 DIAGNOSIS — Z8249 Family history of ischemic heart disease and other diseases of the circulatory system: Secondary | ICD-10-CM

## 2019-08-31 DIAGNOSIS — J309 Allergic rhinitis, unspecified: Secondary | ICD-10-CM | POA: Insufficient documentation

## 2019-08-31 DIAGNOSIS — E669 Obesity, unspecified: Secondary | ICD-10-CM | POA: Diagnosis not present

## 2019-08-31 MED ORDER — ATORVASTATIN CALCIUM 40 MG PO TABS: 40.0000 mg | ORAL_TABLET | Freq: Every day | ORAL | 3 refills | Status: DC

## 2019-08-31 NOTE — Progress Notes (Signed)
Cassandra Irwin is a 69 y.o. female who presents for annual wellness visit and follow-up on chronic medical conditions.  She continues to be followed by Dr. Milta Deiters and is getting hormone replacement through him.  She does have underlying allergies and does use Claritin on an as needed basis.  Continues on Lipitor and having no aches or pains with that.  She had no chest pain, shortness of breath, PND or DOE.  She does smoke and is not interested in quitting.  She has no other concerns or complaints.  Social and family history was unchanged.  Immunizations and Health Maintenance Immunization History  Administered Date(s) Administered  . Influenza,inj,Quad PF,6+ Mos 03/14/2015, 04/02/2016  . PFIZER SARS-COV-2 Vaccination 07/21/2019, 08/10/2019  . Pneumococcal Conjugate-13 04/02/2016  . Pneumococcal Polysaccharide-23 08/22/2017  . Tdap 04/02/2016   Health Maintenance Due  Topic Date Due  . DEXA SCAN  08/16/2017    Last Pap smear: aged out  Last mammogram: 04/16/19 Last colonoscopy:02/08/16 Last DEXA:  Unknown  Done at GYN office  Dentist: 01/2019 Ophtho: 01/2019 Exercise: walking   Other doctors caring for patient include: Dr. Nori Riis GYN  Advanced directives:none on file Does Patient Have a Medical Advance Directive?: Yes Type of Advance Directive: [place] will Does patient want to make changes to medical advance directive?: No - Patient declined Copy of [place] in Chart?: No - copy requested Copy asked for  Depression screen:  See questionnaire below.  Depression screen [facility] 2/9 08/31/2019 08/25/2018 08/22/2017 04/02/2016  Decreased Interest 0 0 0 0  Down, Depressed, Hopeless 0 0 - 0  PHQ - 2 Score 0 0 0 0    Fall Risk Screen: see questionnaire below. Fall Risk  08/31/2019 08/25/2018 08/22/2017 04/02/2016  Falls in the past year? 0 0 No No    ADL screen:  See questionnaire below Functional Status Survey: Is the patient deaf or have  difficulty hearing?: No Does the patient have difficulty seeing, even when wearing glasses/contacts?: No Does the patient have difficulty concentrating, remembering, or making decisions?: No Does the patient have difficulty walking or climbing stairs?: No Does the patient have difficulty dressing or bathing?: No Does the patient have difficulty doing errands alone such as visiting a doctor's office or shopping?: No   Review of Systems Constitutional: -, -unexpected weight change, -anorexia, -fatigue Dermatology: denies changing moles, rash, lumps Cardiology:  -chest pain, -palpitations, -orthopnea, Respiratory: -cough, -shortness of breath, -dyspnea on exertion, -wheezing,  Gastroenterology: -abdominal pain, -nausea, -vomiting, -diarrhea, -constipation, -dysphagia Hematology: -bleeding or bruising problems Musculoskeletal: -arthralgias, -myalgias, -joint swelling, -back pain, - Ophthalmology: -vision changes,  Urology: -dysuria, -difficulty urinating,  -urinary frequency, -urgency, incontinence Neurology: -, -numbness, , -memory loss, -falls, -dizziness    PHYSICAL EXAM:  BP 138/82 (BP Location: Left Arm, Patient Position: Sitting)   Pulse 80   Temp 97.7 F (36.5 C)   Ht 5' 5.5" (1.664 m)   Wt 191 lb 9.6 oz (86.9 kg)   SpO2 97%   BMI 31.40 kg/m   General Appearance: Alert, cooperative, no distress, appears stated age Head: Normocephalic, without obvious abnormality, atraumatic Eyes: PERRL, conjunctiva/corneas clear, EOM's intact, Ears: Normal TM's and external ear canals Nose: Nares normal, mucosa normal, no drainage or sinus tenderness Throat: Lips, mucosa, and tongue normal; teeth and gums normal Neck: Supple, no lymphadenopathy;  thyroid:  no enlargement/tenderness/nodules; no carotid bruit or JVD Lungs: Clear to auscultation bilaterally without wheezes, rales or ronchi; respirations unlabored Heart: Regular rate and  rhythm, S1 and S2 normal, no murmur, rubor  gallop Abdomen: Soft, non-tender, nondistended, normoactive bowel sounds,  no masses, no hepatosplenomegaly Skin:  Skin color, texture, turgor normal, no rashes or lesions Lymph nodes: Cervical, supraclavicular, and axillary nodes normal Neurologic:  CNII-XII intact, normal strength, sensation and gait; reflexes 2+ and symmetric throughout Psych: Normal mood, affect, hygiene and grooming.  ASSESSMENT/PLAN: Hyperlipidemia LDL goal <70 - Plan: Lipid Panel, CBC with Differential, Comprehensive metabolic panel, atorvastatin (LIPITOR) 40 MG tablet  Hot flashes due to menopause  Family history of heart disease - Plan: Lipid Panel, CBC with Differential, Comprehensive metabolic panel, atorvastatin (LIPITOR) 40 MG tablet  Current smoker  Allergic rhinitis, unspecified seasonality, unspecified trigger  Obesity (BMI 30.0-34.9) I discussed smoking cessation with her however she is not interested.  Recommend the use of steroid nasal spray to help with her allergies.  Encouraged her to remain physically active.  Continue on present medications. Immunization recommendations discussed.  Colonoscopy recommendations reviewed   Medicare Attestation I have personally reviewed: The patient's medical and social history Their use of alcohol, tobacco or illicit drugs Their current medications and supplements The patient's functional ability including ADLs,fall risks, home safety risks, cognitive, and hearing and visual impairment Diet and physical activities Evidence for depression or mood disorders  The patient's weight, height, and BMI have been recorded in the chart.  I have made referrals, counseling, and provided education to the patient based on review of the above and I have provided the patient with a written personalized care plan for preventive services.     Jill Alexanders, MD   08/31/2019

## 2019-09-01 LAB — CBC WITH DIFFERENTIAL/PLATELET
Basophils Absolute: 0 10*3/uL (ref 0.0–0.2)
Basos: 1 %
EOS (ABSOLUTE): 0.1 10*3/uL (ref 0.0–0.4)
Eos: 2 %
Hematocrit: 47.3 % — ABNORMAL HIGH (ref 34.0–46.6)
Hemoglobin: 15.9 g/dL (ref 11.1–15.9)
Immature Grans (Abs): 0 10*3/uL (ref 0.0–0.1)
Immature Granulocytes: 0 %
Lymphocytes Absolute: 1.1 10*3/uL (ref 0.7–3.1)
Lymphs: 21 %
MCH: 29.6 pg (ref 26.6–33.0)
MCHC: 33.6 g/dL (ref 31.5–35.7)
MCV: 88 fL (ref 79–97)
Monocytes Absolute: 0.4 10*3/uL (ref 0.1–0.9)
Monocytes: 7 %
Neutrophils Absolute: 3.6 10*3/uL (ref 1.4–7.0)
Neutrophils: 69 %
Platelets: 216 10*3/uL (ref 150–450)
RBC: 5.37 x10E6/uL — ABNORMAL HIGH (ref 3.77–5.28)
RDW: 12.2 % (ref 11.7–15.4)
WBC: 5.3 10*3/uL (ref 3.4–10.8)

## 2019-09-01 LAB — COMPREHENSIVE METABOLIC PANEL
ALT: 14 IU/L (ref 0–32)
AST: 16 IU/L (ref 0–40)
Albumin/Globulin Ratio: 1.6 (ref 1.2–2.2)
Albumin: 4.5 g/dL (ref 3.8–4.8)
Alkaline Phosphatase: 72 IU/L (ref 39–117)
BUN/Creatinine Ratio: 13 (ref 12–28)
BUN: 12 mg/dL (ref 8–27)
Bilirubin Total: 1 mg/dL (ref 0.0–1.2)
CO2: 22 mmol/L (ref 20–29)
Calcium: 9.6 mg/dL (ref 8.7–10.3)
Chloride: 100 mmol/L (ref 96–106)
Creatinine, Ser: 0.89 mg/dL (ref 0.57–1.00)
GFR calc Af Amer: 78 mL/min/{1.73_m2} (ref >59)
GFR calc non Af Amer: 67 mL/min/{1.73_m2} (ref >59)
Globulin, Total: 2.8 g/dL (ref 1.5–4.5)
Glucose: 94 mg/dL (ref 65–99)
Potassium: 4.6 mmol/L (ref 3.5–5.2)
Sodium: 141 mmol/L (ref 134–144)
Total Protein: 7.3 g/dL (ref 6.0–8.5)

## 2019-09-01 LAB — LIPID PANEL
Chol/HDL Ratio: 3.5 ratio (ref 0.0–4.4)
Cholesterol, Total: 178 mg/dL (ref 100–199)
HDL: 51 mg/dL (ref >39)
LDL Chol Calc (NIH): 93 mg/dL (ref 0–99)
Triglycerides: 200 mg/dL — ABNORMAL HIGH (ref 0–149)
VLDL Cholesterol Cal: 34 mg/dL (ref 5–40)

## 2019-09-08 ENCOUNTER — Telehealth: Payer: Self-pay | Admitting: Family Medicine

## 2019-09-08 NOTE — Telephone Encounter (Signed)
Requested records received from [facility] °

## 2020-03-09 DIAGNOSIS — Z23 Encounter for immunization: Secondary | ICD-10-CM | POA: Diagnosis not present

## 2020-04-03 ENCOUNTER — Other Ambulatory Visit: Payer: Self-pay | Admitting: Obstetrics & Gynecology

## 2020-04-03 DIAGNOSIS — Z1231 Encounter for screening mammogram for malignant neoplasm of breast: Secondary | ICD-10-CM

## 2020-04-19 ENCOUNTER — Other Ambulatory Visit: Payer: Self-pay

## 2020-04-19 ENCOUNTER — Ambulatory Visit
Admission: RE | Admit: 2020-04-19 | Discharge: 2020-04-19 | Disposition: A | Discharge status: Home / Self Care | Payer: Medicare Other | Source: Ambulatory Visit

## 2020-04-19 DIAGNOSIS — Z1231 Encounter for screening mammogram for malignant neoplasm of breast: Secondary | ICD-10-CM

## 2020-04-20 DIAGNOSIS — Z6830 Body mass index (BMI) 30.0-30.9, adult: Secondary | ICD-10-CM | POA: Diagnosis not present

## 2020-04-20 DIAGNOSIS — Z124 Encounter for screening for malignant neoplasm of cervix: Secondary | ICD-10-CM | POA: Diagnosis not present

## 2020-04-24 ENCOUNTER — Other Ambulatory Visit: Payer: Self-pay | Admitting: Obstetrics & Gynecology

## 2020-04-24 DIAGNOSIS — R928 Other abnormal and inconclusive findings on diagnostic imaging of breast: Secondary | ICD-10-CM

## 2020-04-25 ENCOUNTER — Other Ambulatory Visit (INDEPENDENT_AMBULATORY_CARE_PROVIDER_SITE_OTHER): Payer: Medicare Other

## 2020-04-25 ENCOUNTER — Other Ambulatory Visit: Payer: Self-pay

## 2020-04-25 DIAGNOSIS — Z23 Encounter for immunization: Secondary | ICD-10-CM

## 2020-05-08 ENCOUNTER — Other Ambulatory Visit: Payer: Self-pay

## 2020-05-08 ENCOUNTER — Ambulatory Visit
Admission: RE | Admit: 2020-05-08 | Discharge: 2020-05-08 | Disposition: A | Discharge status: Home / Self Care | Payer: Medicare Other | Source: Ambulatory Visit | Attending: Obstetrics & Gynecology | Admitting: Obstetrics & Gynecology

## 2020-05-08 ENCOUNTER — Other Ambulatory Visit: Payer: Self-pay | Admitting: Obstetrics & Gynecology

## 2020-05-08 DIAGNOSIS — R922 Inconclusive mammogram: Secondary | ICD-10-CM | POA: Diagnosis not present

## 2020-05-08 DIAGNOSIS — N6310 Unspecified lump in the right breast, unspecified quadrant: Secondary | ICD-10-CM

## 2020-05-08 DIAGNOSIS — R928 Other abnormal and inconclusive findings on diagnostic imaging of breast: Secondary | ICD-10-CM

## 2020-05-08 DIAGNOSIS — N6489 Other specified disorders of breast: Secondary | ICD-10-CM | POA: Diagnosis not present

## 2020-05-17 ENCOUNTER — Other Ambulatory Visit: Payer: Self-pay | Admitting: Obstetrics & Gynecology

## 2020-05-17 ENCOUNTER — Ambulatory Visit
Admission: RE | Admit: 2020-05-17 | Discharge: 2020-05-17 | Disposition: A | Discharge status: Home / Self Care | Payer: Medicare Other | Source: Ambulatory Visit | Attending: Obstetrics & Gynecology | Admitting: Obstetrics & Gynecology

## 2020-05-17 ENCOUNTER — Other Ambulatory Visit: Payer: Self-pay

## 2020-05-17 DIAGNOSIS — N6310 Unspecified lump in the right breast, unspecified quadrant: Secondary | ICD-10-CM

## 2020-05-17 DIAGNOSIS — N6021 Fibroadenosis of right breast: Secondary | ICD-10-CM | POA: Diagnosis not present

## 2020-05-17 DIAGNOSIS — N6311 Unspecified lump in the right breast, upper outer quadrant: Secondary | ICD-10-CM | POA: Diagnosis not present

## 2020-05-17 HISTORY — PX: BREAST BIOPSY: SHX20

## 2020-08-24 ENCOUNTER — Other Ambulatory Visit: Payer: Self-pay | Admitting: Family Medicine

## 2020-08-24 DIAGNOSIS — E785 Hyperlipidemia, unspecified: Secondary | ICD-10-CM

## 2020-08-24 DIAGNOSIS — Z8249 Family history of ischemic heart disease and other diseases of the circulatory system: Secondary | ICD-10-CM

## 2020-08-31 ENCOUNTER — Ambulatory Visit (INDEPENDENT_AMBULATORY_CARE_PROVIDER_SITE_OTHER): Payer: Medicare Other | Admitting: Family Medicine

## 2020-08-31 ENCOUNTER — Other Ambulatory Visit: Payer: Self-pay

## 2020-08-31 ENCOUNTER — Encounter: Payer: Self-pay | Admitting: Family Medicine

## 2020-08-31 VITALS — BP 132/80 | HR 74 | Temp 98.4°F | Ht 65.5 in | Wt 189.2 lb

## 2020-08-31 DIAGNOSIS — E669 Obesity, unspecified: Secondary | ICD-10-CM

## 2020-08-31 DIAGNOSIS — J309 Allergic rhinitis, unspecified: Secondary | ICD-10-CM

## 2020-08-31 DIAGNOSIS — Z8249 Family history of ischemic heart disease and other diseases of the circulatory system: Secondary | ICD-10-CM | POA: Diagnosis not present

## 2020-08-31 DIAGNOSIS — F17200 Nicotine dependence, unspecified, uncomplicated: Secondary | ICD-10-CM | POA: Diagnosis not present

## 2020-08-31 DIAGNOSIS — N951 Menopausal and female climacteric states: Secondary | ICD-10-CM | POA: Diagnosis not present

## 2020-08-31 DIAGNOSIS — E785 Hyperlipidemia, unspecified: Secondary | ICD-10-CM

## 2020-08-31 MED ORDER — ATORVASTATIN CALCIUM 40 MG PO TABS: 40.0000 mg | ORAL_TABLET | Freq: Every day | ORAL | 3 refills | Status: DC

## 2020-08-31 NOTE — Patient Instructions (Signed)
  Cassandra Irwin , Thank you for taking time to come for your Medicare Wellness Visit. I appreciate your ongoing commitment to your health goals. Please review the following plan we discussed and let me know if I can assist you in the future.   These are the goals we discussed: Goals   None     This is a list of the screening recommended for you and due dates:  Health Maintenance  Topic Date Due  . COVID-19 Vaccine (3 - Booster for Pfizer series) 02/07/2020  . Mammogram  04/19/2022  . Colon Cancer Screening  02/07/2026  . Tetanus Vaccine  04/02/2026  . Flu Shot  Completed  . DEXA scan (bone density measurement)  Completed  .  Hepatitis C: One time screening is recommended by Center for Disease Control  (CDC) for  adults born from [birth year] through 1963.   Completed  . Pneumonia vaccines  Completed  . HPV Vaccine  Aged Out

## 2020-08-31 NOTE — Progress Notes (Signed)
Cassandra Irwin is a 70 y.o. female who presents for annual wellness visit and follow-up on chronic medical conditions.  She continues to be followed by Dr. Nori Riis and is getting her hormone replacement through him.  She does smoke roughly half a pack per day and is not interested in quitting.  Her allergies seem to be under good control.  There is a family history of heart disease.  She continues on Lipitor and is having no difficulty with that.  She did stop taking her aspirin.  Otherwise she is on no medications.  Her physical activity is moderate.  Home life is going well.  Immunizations and Health Maintenance Immunization History  Administered Date(s) Administered  . Fluad Quad(high Dose 65+) 04/25/2020  . Influenza,inj,Quad PF,6+ Mos 03/14/2015, 04/02/2016  . Influenza-Unspecified 04/21/2018, 04/02/2019  . PFIZER(Purple Top)SARS-COV-2 Vaccination 07/21/2019, 08/10/2019  . Pneumococcal Conjugate-13 04/02/2016  . Pneumococcal Polysaccharide-23 08/22/2017  . Tdap 04/02/2016   Health Maintenance Due  Topic Date Due  . COVID-19 Vaccine (3 - Booster for Pfizer series) 02/07/2020    Last Pap smear: aged out  Last mammogram: 04/19/20 Last colonoscopy: 02/08/16 Last DEXA: 03/16/12 Dentist: Q four months Ophtho: Q year Exercise: walking and yard work   Other doctors caring for patient include: Dr. Nori Riis obgyn, Dr. Oletta Lamas GI  Advanced directives: Does Patient Have a Medical Advance Directive?: Yes Type of Advance Directive: Living will,Healthcare Power of Attorney Does patient want to make changes to medical advance directive?: No - Patient declined Copy of  in Chart?: No - copy requested  Depression screen:  See questionnaire below.  Depression screen [facility] 2/9 08/31/2020 08/31/2019 08/25/2018 08/22/2017 04/02/2016  Decreased Interest 0 0 0 0 0  Down, Depressed, Hopeless 0 0 0 - 0  PHQ - 2 Score 0 0 0 0 0    Fall Risk Screen: see questionnaire below. Fall Risk   08/31/2020 08/31/2019 08/25/2018 08/22/2017 04/02/2016  Falls in the past year? 0 0 0 No No  Number falls in past yr: 0 - - - -  Injury with Fall? 0 - - - -  Risk for fall due to : No Fall Risks - - - -  Follow up Falls evaluation completed - - - -    ADL screen:  See questionnaire below Functional Status Survey: Is the patient deaf or have difficulty hearing?: No Does the patient have difficulty seeing, even when wearing glasses/contacts?: No Does the patient have difficulty concentrating, remembering, or making decisions?: No Does the patient have difficulty walking or climbing stairs?: No Does the patient have difficulty dressing or bathing?: No Does the patient have difficulty doing errands alone such as visiting a doctor's office or shopping?: No   Review of Systems Constitutional: -, -unexpected weight change, -anorexia, -fatigue Allergy: -sneezing, -itching, -congestion Dermatology: denies changing moles, rash, lumps ENT: -runny nose, -ear pain, -sore throat,  Cardiology:  -chest pain, -palpitations, -orthopnea, Respiratory: -cough, -shortness of breath, -dyspnea on exertion, -wheezing,  Gastroenterology: -abdominal pain, -nausea, -vomiting, -diarrhea, -constipation, -dysphagia Hematology: -bleeding or bruising problems Musculoskeletal: -arthralgias, -myalgias, -joint swelling, -back pain, - Ophthalmology: -vision changes,  Urology: -dysuria, -difficulty urinating,  -urinary frequency, -urgency, incontinence Neurology: -, -numbness, , -memory loss, -falls, -dizziness    PHYSICAL EXAM:   General Appearance: Alert, cooperative, no distress, appears stated age Head: Normocephalic, without obvious abnormality, atraumatic Eyes: PERRL, conjunctiva/corneas clear, EOM's intact, Ears: Normal TM's and external ear canals Nose: Nares normal, mucosa normal, no drainage or sinus tenderness Throat:  Lips, mucosa, and tongue normal; teeth and gums normal Neck: Supple, no lymphadenopathy;   thyroid:  no enlargement/tenderness/nodules; no carotid bruit or JVD Lungs: Clear to auscultation bilaterally without wheezes, rales or ronchi; respirations unlabored Heart: Regular rate and rhythm, S1 and S2 normal, no murmur, rubor gallop Abdomen: Soft, non-tender, nondistended, normoactive bowel sounds,  no masses, no hepatosplenomegaly Extremities: No clubbing, cyanosis or edema Pulses: 2+ and symmetric all extremities Skin:  Skin color, texture, turgor normal, no rashes or lesions Lymph nodes: Cervical, supraclavicular, and axillary nodes normal Neurologic:  CNII-XII intact, normal strength, sensation and gait; reflexes 2+ and symmetric throughout Psych: Normal mood, affect, hygiene and grooming.  ASSESSMENT/PLAN: Hyperlipidemia LDL goal <70 - Plan: Lipid panel, atorvastatin (LIPITOR) 40 MG tablet  Hot flashes due to menopause  Family history of heart disease - Plan: CBC with Differential/Platelet, Comprehensive metabolic panel, Lipid panel, atorvastatin (LIPITOR) 40 MG tablet  Current smoker  Allergic rhinitis, unspecified seasonality, unspecified trigger  Obesity (BMI 30.0-34.9) - Plan: CBC with Differential/Platelet, Comprehensive metabolic panel, Lipid panel  Discussed 20 minutes of aerobic activity at least 5 days/week and weight-bearing exercise 2x/week;   Immunization recommendations discussed.  Colonoscopy recommendations reviewed   Medicare Attestation I have personally reviewed: The patient's medical and social history Their use of alcohol, tobacco or illicit drugs Their current medications and supplements The patient's functional ability including ADLs,fall risks, home safety risks, cognitive, and hearing and visual impairment Diet and physical activities Evidence for depression or mood disorders  The patient's weight, height, and BMI have been recorded in the chart.  I have made referrals, counseling, and provided education to the patient based on review of the  above and I have provided the patient with a written personalized care plan for preventive services.     Jill Alexanders, MD   08/31/2020

## 2020-09-01 LAB — LIPID PANEL
Chol/HDL Ratio: 3.5 ratio (ref 0.0–4.4)
Cholesterol, Total: 170 mg/dL (ref 100–199)
HDL: 49 mg/dL (ref >39)
LDL Chol Calc (NIH): 93 mg/dL (ref 0–99)
Triglycerides: 162 mg/dL — ABNORMAL HIGH (ref 0–149)
VLDL Cholesterol Cal: 28 mg/dL (ref 5–40)

## 2020-09-01 LAB — COMPREHENSIVE METABOLIC PANEL
ALT: 13 IU/L (ref 0–32)
AST: 17 IU/L (ref 0–40)
Albumin/Globulin Ratio: 2.2 (ref 1.2–2.2)
Albumin: 4.6 g/dL (ref 3.8–4.8)
Alkaline Phosphatase: 71 IU/L (ref 44–121)
BUN/Creatinine Ratio: 17 (ref 12–28)
BUN: 14 mg/dL (ref 8–27)
Bilirubin Total: 1.2 mg/dL (ref 0.0–1.2)
CO2: 22 mmol/L (ref 20–29)
Calcium: 9.5 mg/dL (ref 8.7–10.3)
Chloride: 101 mmol/L (ref 96–106)
Creatinine, Ser: 0.84 mg/dL (ref 0.57–1.00)
Globulin, Total: 2.1 g/dL (ref 1.5–4.5)
Glucose: 98 mg/dL (ref 65–99)
Potassium: 4.6 mmol/L (ref 3.5–5.2)
Sodium: 139 mmol/L (ref 134–144)
Total Protein: 6.7 g/dL (ref 6.0–8.5)
eGFR: 76 mL/min/{1.73_m2} (ref >59)

## 2020-09-01 LAB — CBC WITH DIFFERENTIAL/PLATELET
Basophils Absolute: 0 10*3/uL (ref 0.0–0.2)
Basos: 1 %
EOS (ABSOLUTE): 0.1 10*3/uL (ref 0.0–0.4)
Eos: 2 %
Hematocrit: 46.7 % — ABNORMAL HIGH (ref 34.0–46.6)
Hemoglobin: 16 g/dL — ABNORMAL HIGH (ref 11.1–15.9)
Immature Grans (Abs): 0 10*3/uL (ref 0.0–0.1)
Immature Granulocytes: 0 %
Lymphocytes Absolute: 1 10*3/uL (ref 0.7–3.1)
Lymphs: 19 %
MCH: 29.5 pg (ref 26.6–33.0)
MCHC: 34.3 g/dL (ref 31.5–35.7)
MCV: 86 fL (ref 79–97)
Monocytes Absolute: 0.4 10*3/uL (ref 0.1–0.9)
Monocytes: 7 %
Neutrophils Absolute: 3.8 10*3/uL (ref 1.4–7.0)
Neutrophils: 71 %
Platelets: 228 10*3/uL (ref 150–450)
RBC: 5.42 x10E6/uL — ABNORMAL HIGH (ref 3.77–5.28)
RDW: 12.4 % (ref 11.7–15.4)
WBC: 5.3 10*3/uL (ref 3.4–10.8)

## 2020-12-01 ENCOUNTER — Other Ambulatory Visit (HOSPITAL_BASED_OUTPATIENT_CLINIC_OR_DEPARTMENT_OTHER): Payer: Self-pay

## 2020-12-01 ENCOUNTER — Other Ambulatory Visit: Payer: Self-pay

## 2020-12-01 ENCOUNTER — Ambulatory Visit: Payer: Medicare Other | Attending: Internal Medicine

## 2020-12-01 DIAGNOSIS — Z23 Encounter for immunization: Secondary | ICD-10-CM

## 2020-12-01 MED ORDER — PFIZER-BIONT COVID-19 VAC-TRIS 30 MCG/0.3ML IM SUSP
INTRAMUSCULAR | 0 refills | Status: DC
Start: 2020-12-01 — End: 2021-09-18
  Filled 2020-12-01: qty 0.3, 1d supply, fill #0

## 2020-12-01 NOTE — Progress Notes (Signed)
   Covid-19 Vaccination Clinic  Name:  Cassandra Irwin    MRN: [id number] DOB: Oct 29, 1950  12/01/2020  [name] was observed post Covid-19 immunization for 15 minutes without incident. She was provided with Vaccine Information Sheet and instruction to access the V-Safe system.   [name] was instructed to call 911 with any severe reactions post vaccine: Marland Kitchen Difficulty breathing  . Swelling of face and throat  . A fast heartbeat  . A bad rash all over body  . Dizziness and weakness   Immunizations Administered    Name Date Dose VIS Date Route   PFIZER Comrnaty(Gray TOP) Covid-19 Vaccine 12/01/2020  9:47 AM 0.3 mL 06/08/2020 Intramuscular   Manufacturer: [place]   Lot: [id number]   [place]: [phone]

## 2021-04-09 ENCOUNTER — Other Ambulatory Visit: Payer: Self-pay | Admitting: Obstetrics & Gynecology

## 2021-04-09 DIAGNOSIS — Z1231 Encounter for screening mammogram for malignant neoplasm of breast: Secondary | ICD-10-CM

## 2021-04-17 ENCOUNTER — Other Ambulatory Visit (HOSPITAL_BASED_OUTPATIENT_CLINIC_OR_DEPARTMENT_OTHER): Payer: Self-pay

## 2021-04-17 MED ORDER — INFLUENZA VAC A&B SA ADJ QUAD 0.5 ML IM PRSY
PREFILLED_SYRINGE | INTRAMUSCULAR | 0 refills | Status: DC
  Filled 2021-04-17: qty 0.5, 1d supply, fill #0

## 2021-04-18 DIAGNOSIS — Z23 Encounter for immunization: Secondary | ICD-10-CM | POA: Diagnosis not present

## 2021-04-24 DIAGNOSIS — Z6831 Body mass index (BMI) 31.0-31.9, adult: Secondary | ICD-10-CM | POA: Diagnosis not present

## 2021-04-24 DIAGNOSIS — Z01419 Encounter for gynecological examination (general) (routine) without abnormal findings: Secondary | ICD-10-CM | POA: Diagnosis not present

## 2021-05-21 ENCOUNTER — Ambulatory Visit
Admission: RE | Admit: 2021-05-21 | Discharge: 2021-05-21 | Disposition: A | Discharge status: Home / Self Care | Payer: Medicare Other | Source: Ambulatory Visit | Attending: Obstetrics & Gynecology | Admitting: Obstetrics & Gynecology

## 2021-05-21 ENCOUNTER — Other Ambulatory Visit: Payer: Self-pay

## 2021-05-21 DIAGNOSIS — Z1231 Encounter for screening mammogram for malignant neoplasm of breast: Secondary | ICD-10-CM | POA: Diagnosis not present

## 2021-05-28 ENCOUNTER — Ambulatory Visit: Payer: Medicare Other | Attending: Internal Medicine

## 2021-05-28 ENCOUNTER — Other Ambulatory Visit: Payer: Self-pay

## 2021-05-28 ENCOUNTER — Other Ambulatory Visit (HOSPITAL_BASED_OUTPATIENT_CLINIC_OR_DEPARTMENT_OTHER): Payer: Self-pay

## 2021-05-28 DIAGNOSIS — Z23 Encounter for immunization: Secondary | ICD-10-CM | POA: Diagnosis not present

## 2021-05-28 MED ORDER — PFIZER COVID-19 VAC BIVALENT 30 MCG/0.3ML IM SUSP
INTRAMUSCULAR | 0 refills | Status: DC
Start: 2021-05-28 — End: 2021-09-18
  Filled 2021-05-28: qty 0.3, 1d supply, fill #0

## 2021-05-28 NOTE — Progress Notes (Signed)
   Covid-19 Vaccination Clinic  Name:  MARIJA CALAMARI    MRN: [id number] DOB: 21-Oct-1950  05/28/2021  Ms. General was observed post Covid-19 immunization for 15 minutes without incident. She was provided with Vaccine Information Sheet and instruction to access the V-Safe system.   Ms. Bultman was instructed to call 911 with any severe reactions post vaccine: Difficulty breathing  Swelling of face and throat  A fast heartbeat  A bad rash all over body  Dizziness and weakness   Immunizations Administered     Name Date Dose VIS Date Route   Pfizer Covid-19 Vaccine Bivalent Booster 05/28/2021  2:30 PM 0.3 mL 02/28/2021 Intramuscular   Manufacturer: [place]   Lot: [id number]   [place]: [phone]

## 2021-07-27 DIAGNOSIS — H2513 Age-related nuclear cataract, bilateral: Secondary | ICD-10-CM | POA: Diagnosis not present

## 2021-08-11 IMAGING — MG MM BREAST LOCALIZATION CLIP
4 series · 4 of 12 positions shown · non-contrast
Comparison: Previous exam(s).

CLINICAL DATA: Status post ultrasound-guided core biopsy of mass in
the 9:30 o'clock location of the RIGHT breast.

EXAM:
DIAGNOSTIC RIGHT MAMMOGRAM POST ULTRASOUND BIOPSY

[R CC synth-2D]
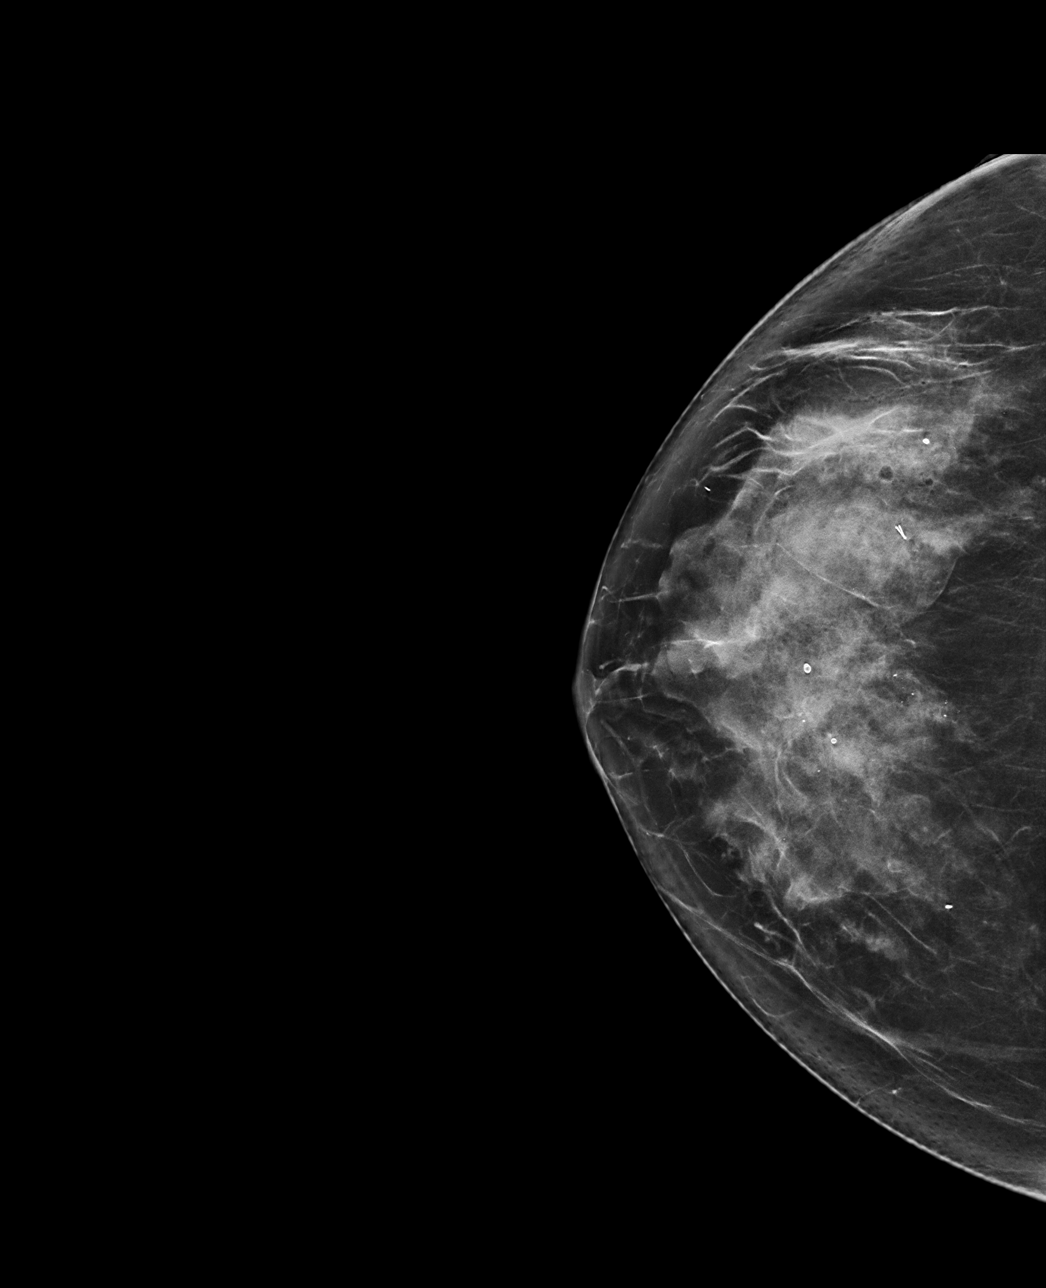

[R ML synth-2D]
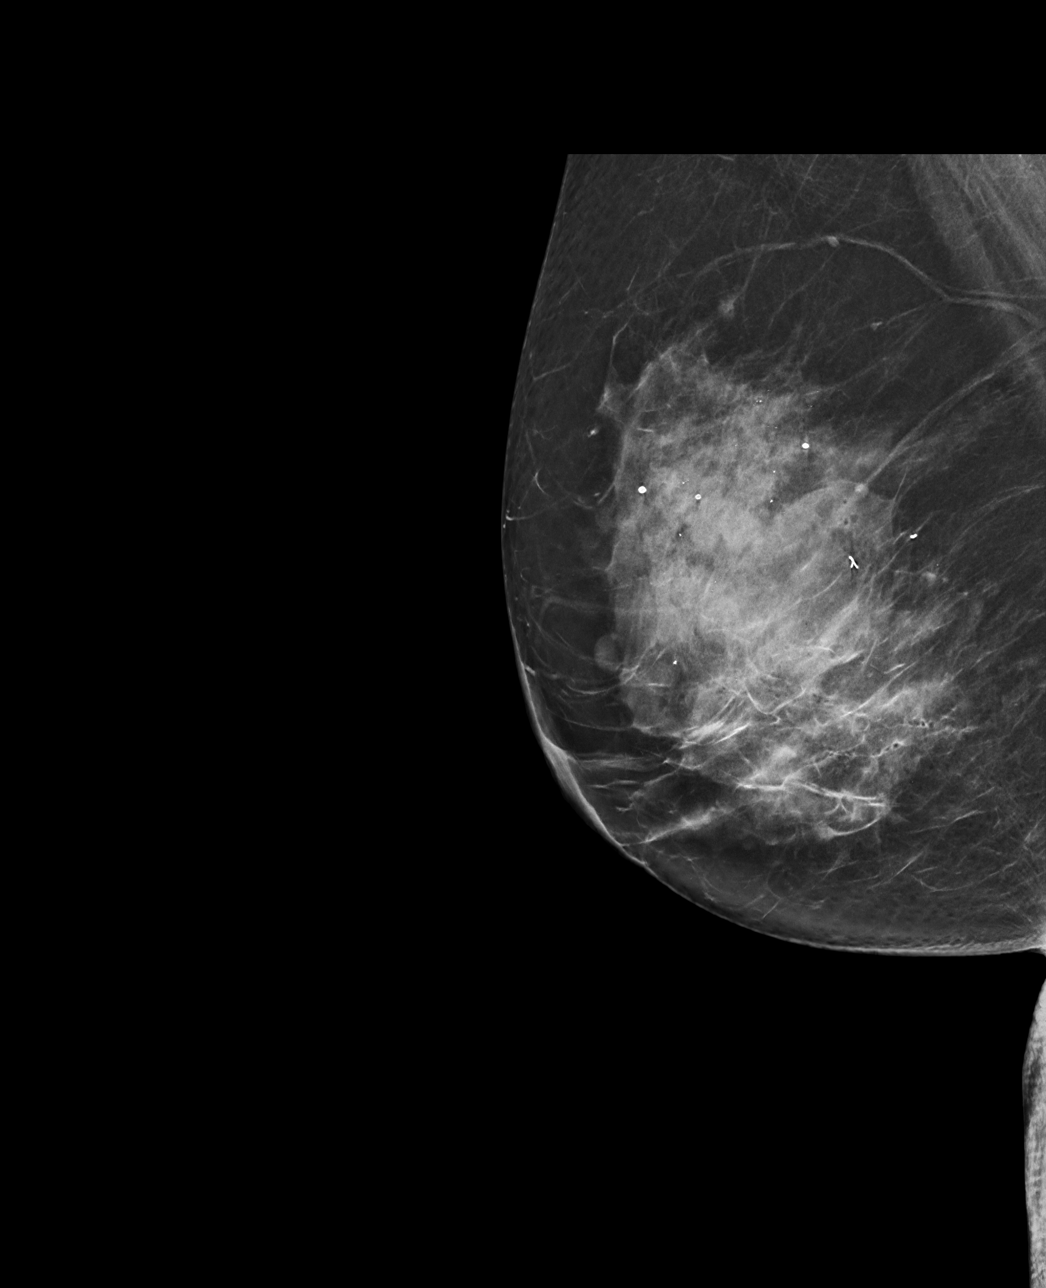

[R CC tomo · tomo slice 39/78.0]
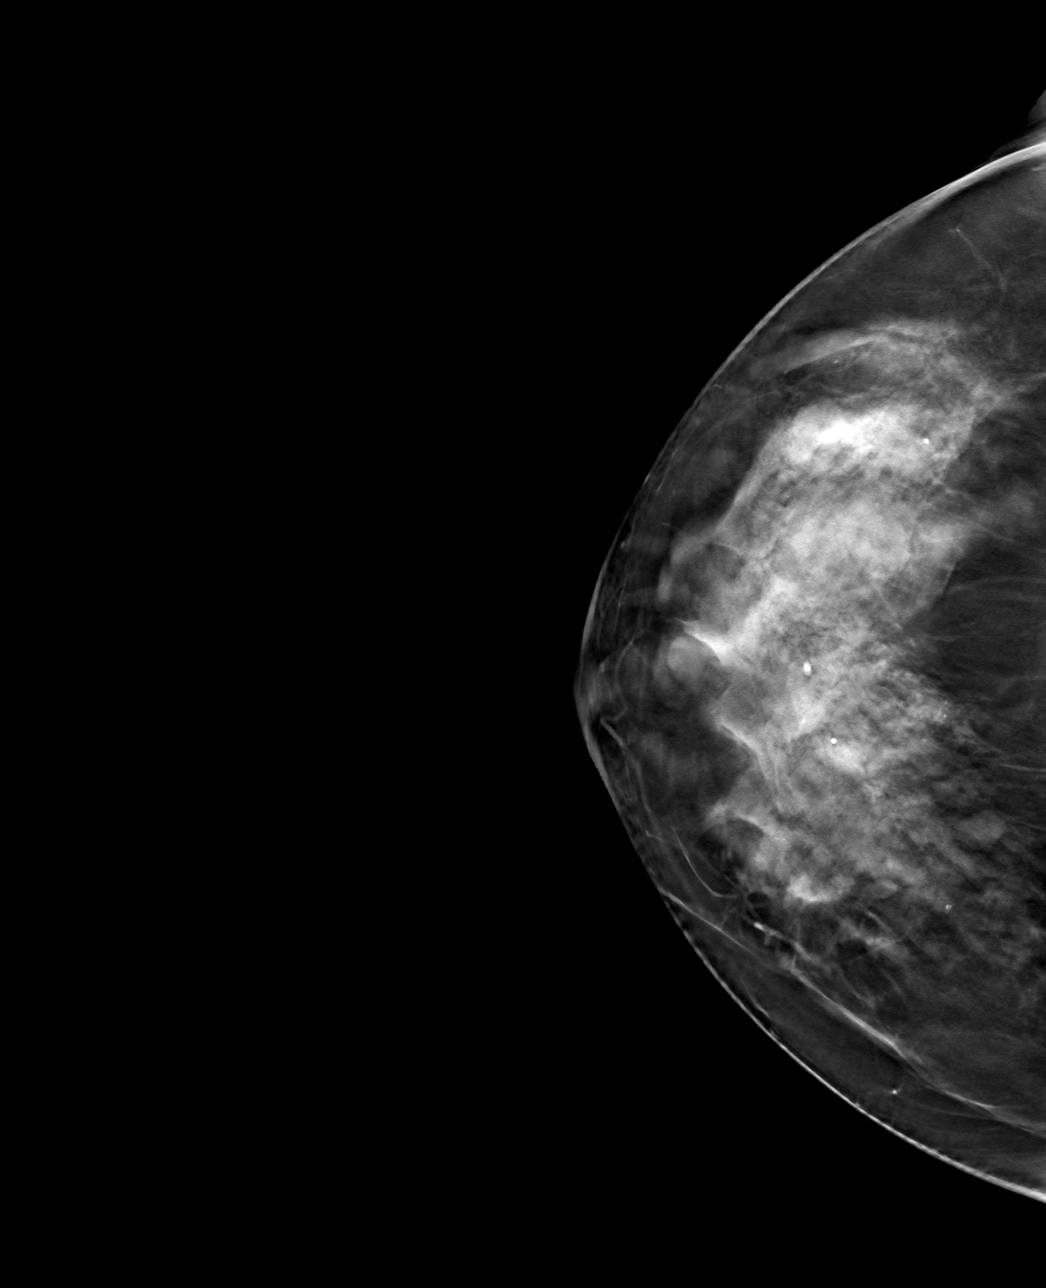

[R ML tomo · tomo slice 39/76.0]
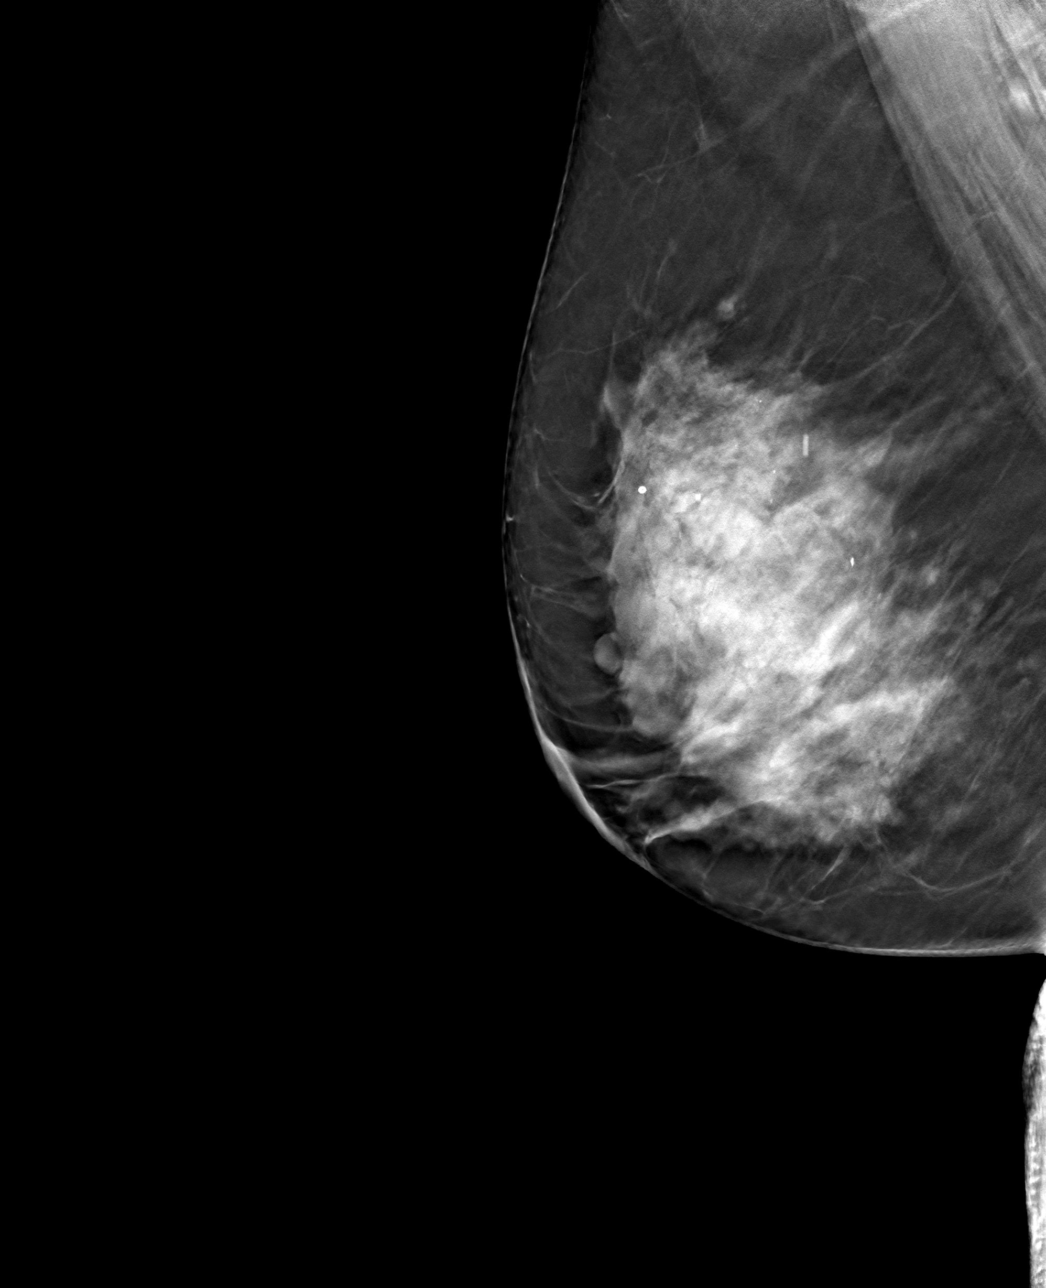

[4 of 12 positions shown; findings below may reference images not displayed]

FINDINGS: Mammographic images were obtained following ultrasound guided biopsy
of mass in the 9:30 o'clock location of the RIGHT breast and
placement of a ribbon shaped clip. The biopsy marking clip is in
expected position at the site of biopsy.
IMPRESSION: Appropriate positioning of the ribbon shaped biopsy marking clip at
the site of biopsy in the mass in the UPPER-OUTER QUADRANT of the
RIGHT breast.

Final Assessment: Post Procedure Mammograms for Marker Placement

## 2021-08-11 IMAGING — US US  BREAST BX W/ LOC DEV 1ST LESION IMG BX SPEC US GUIDE*R*
1 series · 12 of 15 positions shown · non-contrast
Comparison: Previous exam(s).
COMPARISON: Previous exam(s).

Addendum:
CLINICAL DATA: Patient presents for ultrasound-guided core biopsy
of mass in the RIGHT breast.

EXAM:
ULTRASOUND GUIDED RIGHT BREAST CORE NEEDLE BIOPSY

[Series 1: us breast bx w/ loc dev 1st lesion img bx spec us  · 0.07mm/px · 12 of 15 slices shown]
[im 1/15]
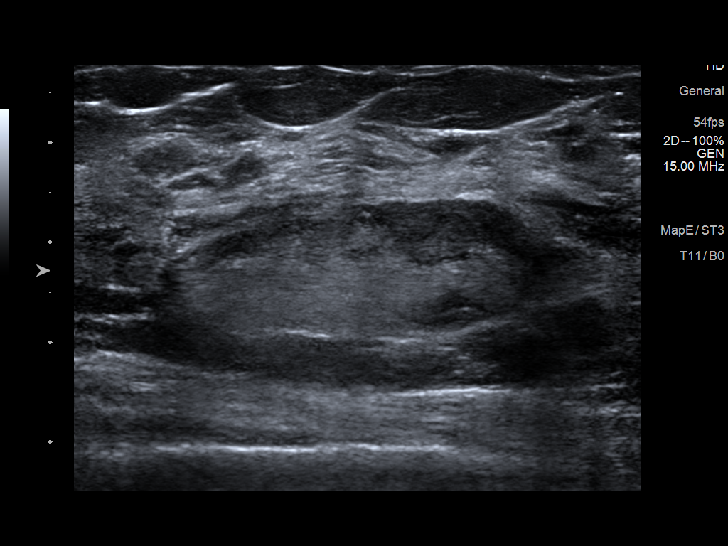
[im 2/15]
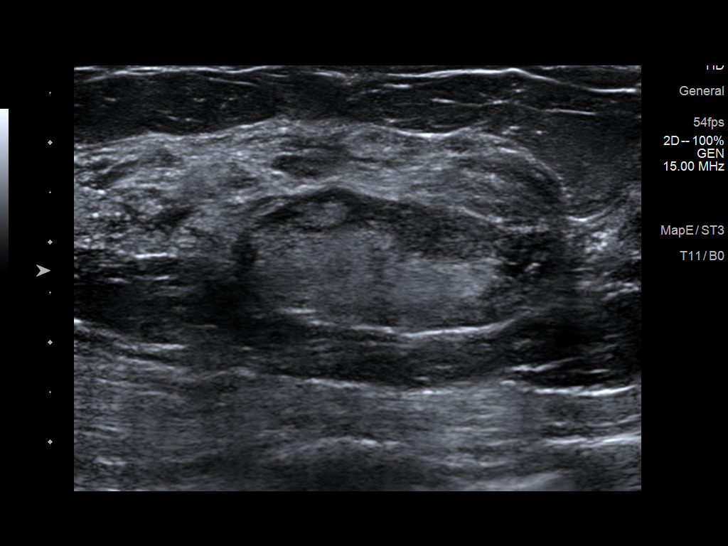
[im 4/15]
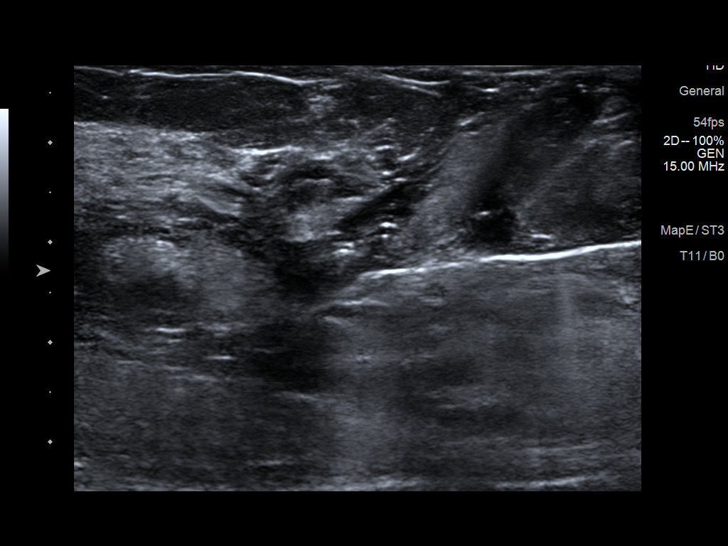
[im 5/15]
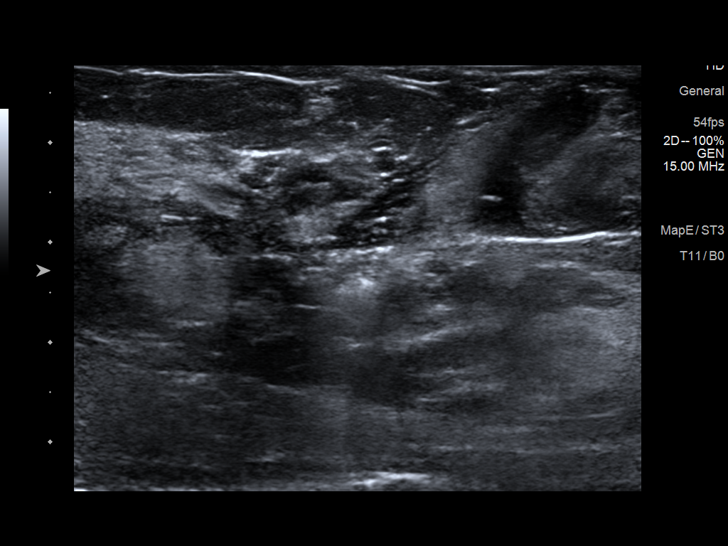
[im 6/15]
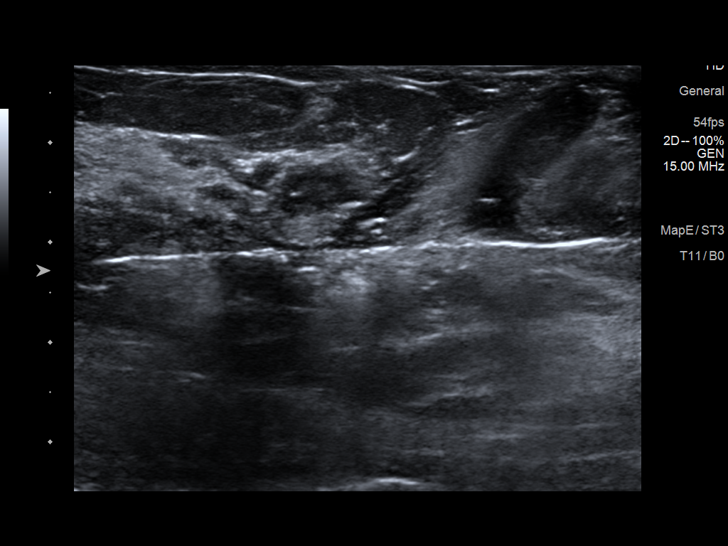
[im 7/15]
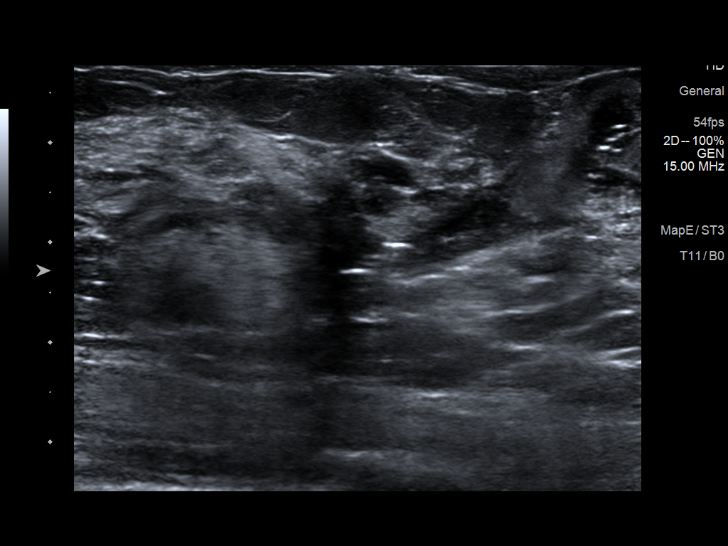
[im 9/15]
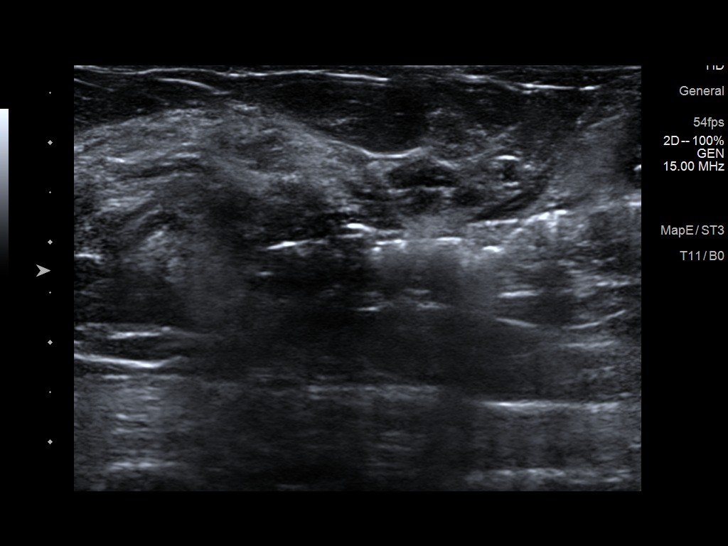
[im 10/15]
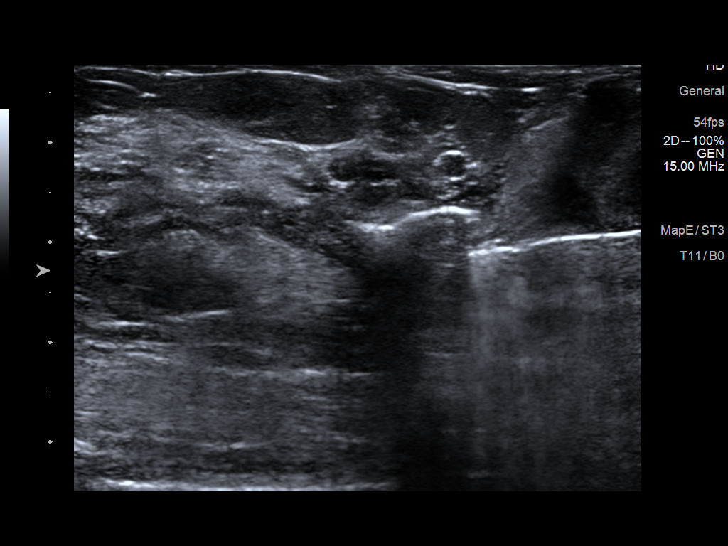
[im 11/15]
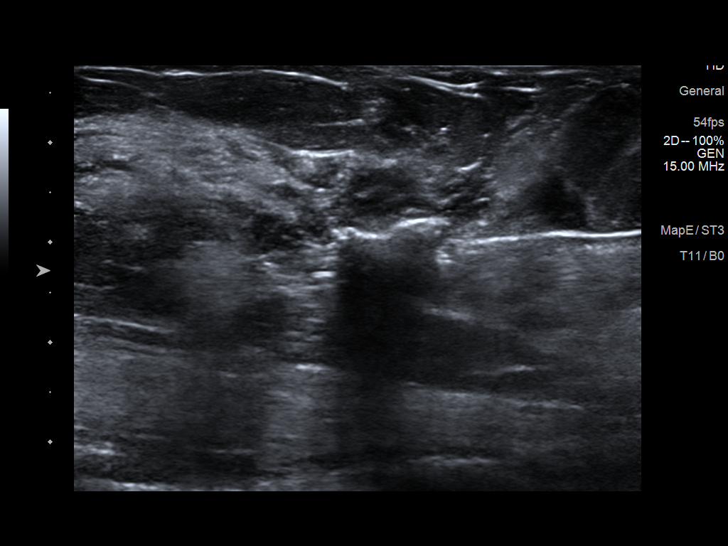
[im 12/15]
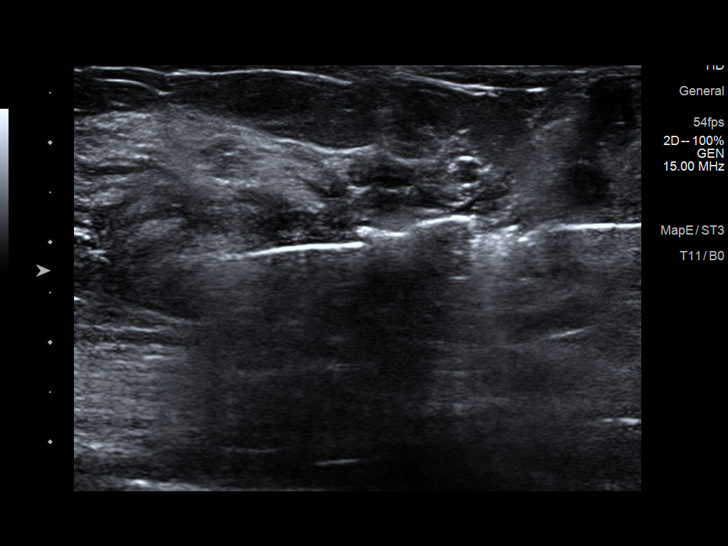
[im 14/15]
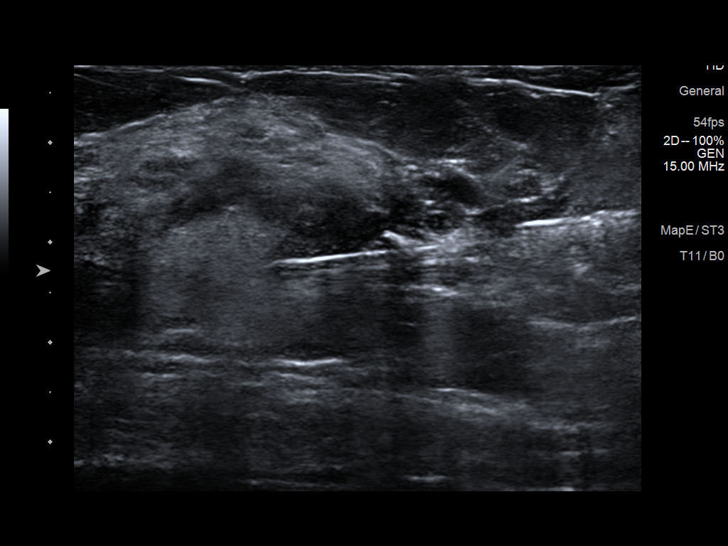
[im 15/15]
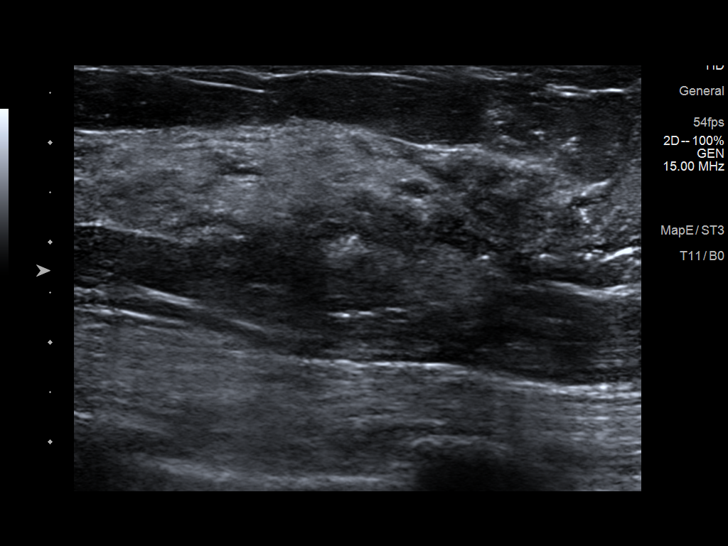

[12 of 15 positions shown; findings below may reference images not displayed]



Lesion quadrant: UPPER-OUTER QUADRANT RIGHT breast

Using sterile technique and 1% Lidocaine as local anesthetic, under
direct ultrasound visualization, a 12 gauge Kaki device was
used to perform biopsy of mass in the 9:30 o'clock location of the
RIGHT breast using a inferior to superior approach. At the
conclusion of the procedure ribbon shaped tissue marker clip was
deployed into the biopsy cavity. Follow up 2 view mammogram was
performed and dictated separately.
IMPRESSION: Ultrasound guided biopsy of RIGHT breast mass. No apparent
complications.

ADDENDUM:
Pathology revealed SCLEROSING ADENOSIS of the Right breast, 9:30
o'clock. This was found to be concordant by Dr. Senaid Gavazzi.

Pathology results were discussed with the patient by telephone. The
patient reported doing well after the biopsy with tenderness at the
site. Post biopsy instructions and care were reviewed and questions
were answered. The patient was encouraged to call The [REDACTED]

The patient was instructed to return for annual screening
mammography and informed a reminder notice would be sent regarding
this appointment.

Pathology results reported by Chang-Yun Ayaah Bundaa, RN on 05/18/2020.



Lesion quadrant: UPPER-OUTER QUADRANT RIGHT breast

Using sterile technique and 1% Lidocaine as local anesthetic, under
direct ultrasound visualization, a 12 gauge Kaki device was
used to perform biopsy of mass in the 9:30 o'clock location of the
RIGHT breast using a inferior to superior approach. At the
conclusion of the procedure ribbon shaped tissue marker clip was
deployed into the biopsy cavity. Follow up 2 view mammogram was
performed and dictated separately.
IMPRESSION: Ultrasound guided biopsy of RIGHT breast mass. No apparent
complications.

## 2021-09-04 ENCOUNTER — Ambulatory Visit: Payer: Medicare Other | Admitting: Family Medicine

## 2021-09-11 ENCOUNTER — Telehealth: Payer: Self-pay | Admitting: Family Medicine

## 2021-09-11 NOTE — Telephone Encounter (Signed)
L/m message asking pt to call [phone] ? ?Patient has AWV/med check 09/18/21.  I wanted to see if patient could go AWV with [place] on 3/17 either virtual or in office.  And leave med check with pcp  ?

## 2021-09-18 ENCOUNTER — Other Ambulatory Visit: Payer: Self-pay

## 2021-09-18 ENCOUNTER — Encounter: Payer: Self-pay | Admitting: Family Medicine

## 2021-09-18 ENCOUNTER — Ambulatory Visit (INDEPENDENT_AMBULATORY_CARE_PROVIDER_SITE_OTHER): Payer: Medicare Other | Admitting: Family Medicine

## 2021-09-18 VITALS — BP 130/72 | HR 74 | Temp 97.5°F | Ht 65.5 in | Wt 186.6 lb

## 2021-09-18 DIAGNOSIS — Z8249 Family history of ischemic heart disease and other diseases of the circulatory system: Secondary | ICD-10-CM

## 2021-09-18 DIAGNOSIS — N951 Menopausal and female climacteric states: Secondary | ICD-10-CM

## 2021-09-18 DIAGNOSIS — E785 Hyperlipidemia, unspecified: Secondary | ICD-10-CM

## 2021-09-18 DIAGNOSIS — J309 Allergic rhinitis, unspecified: Secondary | ICD-10-CM

## 2021-09-18 DIAGNOSIS — Z0000 Encounter for general adult medical examination without abnormal findings: Secondary | ICD-10-CM

## 2021-09-18 DIAGNOSIS — F17200 Nicotine dependence, unspecified, uncomplicated: Secondary | ICD-10-CM | POA: Diagnosis not present

## 2021-09-18 DIAGNOSIS — Z86010 Personal history of colonic polyps: Secondary | ICD-10-CM | POA: Diagnosis not present

## 2021-09-18 MED ORDER — ATORVASTATIN CALCIUM 40 MG PO TABS: 40.0000 mg | ORAL_TABLET | Freq: Every day | ORAL | 3 refills | Status: DC

## 2021-09-18 NOTE — Progress Notes (Signed)
Cassandra Irwin is a 71 y.o. female who presents for annual wellness visit and follow-up on chronic medical conditions.  She has no particular concerns or complaints.  She continues to smoke and is not interested in quitting.  She continues on atorvastatin and is having no aches or pains with that.  She does see her gynecologist who has her on Prometrium as well as Vivelle-Dot.  Her allergies are causing minimal symptoms.  She does have a history of colonic polyps and is scheduled for follow-up colonoscopy.  Her marriage is going well. ? ?Immunizations and Health Maintenance ?Immunization History  ?Administered Date(s) Administered  ? Fluad Quad(high Dose 65+) 04/25/2020  ? Influenza,inj,Quad PF,6+ Mos 03/14/2015, 04/02/2016  ? Influenza-Unspecified 04/21/2018, 04/02/2019, 04/18/2021  ? PFIZER Comirnaty(Gray Top)Covid-19 Tri-Sucrose Vaccine 12/01/2020  ? PFIZER(Purple Top)SARS-COV-2 Vaccination 07/21/2019, 08/10/2019  ? Pension scheme manager 29yr & up 05/28/2021  ? Pneumococcal Conjugate-13 04/02/2016  ? Pneumococcal Polysaccharide-23 08/22/2017  ? Tdap 04/02/2016  ? ?Health Maintenance Due  ?Topic Date Due  ? Zoster Vaccines- Shingrix (1 of 2) Never done  ? ? ?Last Pap smear: aged out 12/21 ?Last mammogram: 05/21/21 ?Last colonoscopy: 02/08/16 Dr. MWatt Climes?Last DEXA: 03/16/12 ?Dentist: Q three months ?Ophtho: Q year ?Exercise: N/A ? ?Other doctors caring for patient include: Dr. MWatt ClimesGI ?           Dr. NLynnell Jude?            ?Advanced directives: ?Does Patient Have a Medical Advance Directive?: Yes ?Type of Advance Directive: Living will ?Does patient want to make changes to medical advance directive?: No - Patient declined ? ?Depression screen:  See questionnaire below.  ?[facility]2021 08/31/2020 08/31/2019 08/25/2018 08/22/2017  ?Decreased Interest 0 0 0 0 0  ?Down, Depressed, Hopeless 0 0 0 0 -  ?PHQ - 2 Score 0 0 0 0 0  ? ? ?Fall Risk Screen: see questionnaire below. ?Fall  Risk  09/18/2021 08/31/2020 08/31/2019 08/25/2018 08/22/2017  ?Falls in the past year? 0 0 0 0 No  ?Number falls in past yr: 0 0 - - -  ?Injury with Fall? 0 0 - - -  ?Risk for fall due to : No Fall Risks No Fall Risks - - -  ?Follow up Falls evaluation completed Falls evaluation completed - - -  ? ? ?ADL screen:  See questionnaire below ?Functional Status Survey: ?Is the patient deaf or have difficulty hearing?: No ?Does the patient have difficulty seeing, even when wearing glasses/contacts?: No ?Does the patient have difficulty concentrating, remembering, or making decisions?: No ?Does the patient have difficulty walking or climbing stairs?: No ?Does the patient have difficulty dressing or bathing?: No ?Does the patient have difficulty doing errands alone such as visiting a doctor's office or shopping?: No ? ? ?Review of Systems ?Constitutional: -, -unexpected weight change, -anorexia, -fatigue ?Allergy: -sneezing, -itching, -congestion ?Dermatology: denies changing moles, rash, lumps ?ENT: -runny nose, -ear pain, -sore throat,  ?Cardiology:  -chest pain, -palpitations, -orthopnea, ?Respiratory: -cough, -shortness of breath, -dyspnea on exertion, -wheezing,  ?Gastroenterology: -abdominal pain, -nausea, -vomiting, -diarrhea, -constipation, -dysphagia ?Hematology: -bleeding or bruising problems ?Musculoskeletal: -arthralgias, -myalgias, -joint swelling, -back pain, - ?Ophthalmology: -vision changes,  ?Urology: -dysuria, -difficulty urinating,  -urinary frequency, -urgency, incontinence ?Neurology: -, -numbness, , -memory loss, -falls, -dizziness ?PHYSICAL EXAM: ?General Appearance: Alert, cooperative, no distress, appears stated age ?Head: Normocephalic, without obvious abnormality, atraumatic ?Eyes: PERRL, conjunctiva/corneas clear, EOM's intact,  ?Ears: Normal TM's and external ear canals ?  Nose: Nares normal, mucosa normal, no drainage or sinus tenderness ?Throat: Lips, mucosa, and tongue normal; teeth and gums  normal ?Neck: Supple, no lymphadenopathy;  thyroid:  no enlargement/tenderness/nodules; no carotid bruit or JVD ?Lungs: Clear to auscultation bilaterally without wheezes, rales or ronchi; respirations unlabored ?Heart: Regular rate and rhythm, S1 and S2 normal, no murmur, rubor gallop ?Abdomen: Soft, non-tender, nondistended, normoactive bowel sounds,  ?no masses, no hepatosplenomegaly ?Extremities: No clubbing, cyanosis or edema ?Pulses: 2+ and symmetric all extremities ?Skin:  Skin color, texture, turgor normal, no rashes or lesions ?Lymph nodes: Cervical, supraclavicular, and axillary nodes normal ?Neurologic:  CNII-XII intact, normal strength, sensation and gait; reflexes 2+ and symmetric throughout ?Psych: Normal mood, affect, hygiene and grooming. ? ?ASSESSMENT/PLAN: ?Hot flashes due to menopause - Plan: CBC with Differential/Platelet, Comprehensive metabolic panel ? ?Allergic rhinitis, unspecified seasonality, unspecified trigger - Plan: CBC with Differential/Platelet ? ?Current smoker - Plan: CBC with Differential/Platelet ? ?Family history of heart disease - Plan: atorvastatin (LIPITOR) 40 MG tablet, CBC with Differential/Platelet, Comprehensive metabolic panel, Lipid panel ? ?Hyperlipidemia LDL goal <70 - Plan: atorvastatin (LIPITOR) 40 MG tablet, Lipid panel ? ?Hx of adenomatous colonic polyps ?Discussed continued use of HRT.  Patient not interested in quitting smoking so did not pursue that.  She will treat her allergies as needed.  Continue on atorvastatin.  Follow-up based on colonoscopy. ? ? ?Discussed at least 30 minutes of aerobic activity at least 5 days/week and weight-bearing exercise 2x/week;  Immunization recommendations discussed.  Colonoscopy recommendations reviewed ? ? ?Medicare Attestation ?I have personally reviewed: ?The patient's medical and social history ?Their use of alcohol, tobacco or illicit drugs ?Their current medications and supplements ?The patient's functional ability  including ADLs,fall risks, home safety risks, cognitive, and hearing and visual impairment ?Diet and physical activities ?Evidence for depression or mood disorders ? ?The patient's weight, height, and BMI have been recorded in the chart.  I have made referrals, counseling, and provided education to the patient based on review of the above and I have provided the patient with a written personalized care plan for preventive services.   ? ? ?Jill Alexanders, MD   09/18/2021  ?

## 2021-09-19 LAB — CBC WITH DIFFERENTIAL/PLATELET
Basophils Absolute: 0.1 10*3/uL (ref 0.0–0.2)
Basos: 1 %
EOS (ABSOLUTE): 0.1 10*3/uL (ref 0.0–0.4)
Eos: 2 %
Hematocrit: 46.5 % (ref 34.0–46.6)
Hemoglobin: 16.1 g/dL — ABNORMAL HIGH (ref 11.1–15.9)
Immature Grans (Abs): 0.1 10*3/uL (ref 0.0–0.1)
Immature Granulocytes: 1 %
Lymphocytes Absolute: 1.2 10*3/uL (ref 0.7–3.1)
Lymphs: 21 %
MCH: 30 pg (ref 26.6–33.0)
MCHC: 34.6 g/dL (ref 31.5–35.7)
MCV: 87 fL (ref 79–97)
Monocytes Absolute: 0.4 10*3/uL (ref 0.1–0.9)
Monocytes: 7 %
Neutrophils Absolute: 3.9 10*3/uL (ref 1.4–7.0)
Neutrophils: 68 %
Platelets: 226 10*3/uL (ref 150–450)
RBC: 5.37 x10E6/uL — ABNORMAL HIGH (ref 3.77–5.28)
RDW: 12.4 % (ref 11.7–15.4)
WBC: 5.7 10*3/uL (ref 3.4–10.8)

## 2021-09-19 LAB — LIPID PANEL
Chol/HDL Ratio: 3.5 ratio (ref 0.0–4.4)
Cholesterol, Total: 171 mg/dL (ref 100–199)
HDL: 49 mg/dL (ref >39)
LDL Chol Calc (NIH): 90 mg/dL (ref 0–99)
Triglycerides: 189 mg/dL — ABNORMAL HIGH (ref 0–149)
VLDL Cholesterol Cal: 32 mg/dL (ref 5–40)

## 2021-09-19 LAB — COMPREHENSIVE METABOLIC PANEL
ALT: 12 IU/L (ref 0–32)
AST: 19 IU/L (ref 0–40)
Albumin/Globulin Ratio: 2.1 (ref 1.2–2.2)
Albumin: 4.6 g/dL (ref 3.8–4.8)
Alkaline Phosphatase: 70 IU/L (ref 44–121)
BUN/Creatinine Ratio: 15 (ref 12–28)
BUN: 10 mg/dL (ref 8–27)
Bilirubin Total: 1.3 mg/dL — ABNORMAL HIGH (ref 0.0–1.2)
CO2: 25 mmol/L (ref 20–29)
Calcium: 9.6 mg/dL (ref 8.7–10.3)
Chloride: 100 mmol/L (ref 96–106)
Creatinine, Ser: 0.68 mg/dL (ref 0.57–1.00)
Globulin, Total: 2.2 g/dL (ref 1.5–4.5)
Glucose: 89 mg/dL (ref 70–99)
Potassium: 4.4 mmol/L (ref 3.5–5.2)
Sodium: 139 mmol/L (ref 134–144)
Total Protein: 6.8 g/dL (ref 6.0–8.5)
eGFR: 94 mL/min/{1.73_m2} (ref >59)

## 2021-12-27 DIAGNOSIS — D123 Benign neoplasm of transverse colon: Secondary | ICD-10-CM | POA: Diagnosis not present

## 2021-12-27 DIAGNOSIS — D125 Benign neoplasm of sigmoid colon: Secondary | ICD-10-CM | POA: Diagnosis not present

## 2021-12-27 DIAGNOSIS — K649 Unspecified hemorrhoids: Secondary | ICD-10-CM | POA: Diagnosis not present

## 2021-12-27 DIAGNOSIS — D124 Benign neoplasm of descending colon: Secondary | ICD-10-CM | POA: Diagnosis not present

## 2021-12-27 DIAGNOSIS — D128 Benign neoplasm of rectum: Secondary | ICD-10-CM | POA: Diagnosis not present

## 2021-12-27 DIAGNOSIS — K5730 Diverticulosis of large intestine without perforation or abscess without bleeding: Secondary | ICD-10-CM | POA: Diagnosis not present

## 2021-12-27 DIAGNOSIS — Z86010 Personal history of colonic polyps: Secondary | ICD-10-CM | POA: Diagnosis not present

## 2021-12-27 DIAGNOSIS — D122 Benign neoplasm of ascending colon: Secondary | ICD-10-CM | POA: Diagnosis not present

## 2021-12-27 LAB — HM COLONOSCOPY

## 2021-12-31 DIAGNOSIS — D124 Benign neoplasm of descending colon: Secondary | ICD-10-CM | POA: Diagnosis not present

## 2021-12-31 DIAGNOSIS — D125 Benign neoplasm of sigmoid colon: Secondary | ICD-10-CM | POA: Diagnosis not present

## 2021-12-31 DIAGNOSIS — D122 Benign neoplasm of ascending colon: Secondary | ICD-10-CM | POA: Diagnosis not present

## 2021-12-31 DIAGNOSIS — D128 Benign neoplasm of rectum: Secondary | ICD-10-CM | POA: Diagnosis not present

## 2021-12-31 DIAGNOSIS — D123 Benign neoplasm of transverse colon: Secondary | ICD-10-CM | POA: Diagnosis not present

## 2022-02-21 DIAGNOSIS — N950 Postmenopausal bleeding: Secondary | ICD-10-CM | POA: Diagnosis not present

## 2022-03-06 ENCOUNTER — Encounter: Payer: Self-pay | Admitting: Internal Medicine

## 2022-04-08 ENCOUNTER — Other Ambulatory Visit (HOSPITAL_BASED_OUTPATIENT_CLINIC_OR_DEPARTMENT_OTHER): Payer: Self-pay

## 2022-04-08 DIAGNOSIS — Z23 Encounter for immunization: Secondary | ICD-10-CM | POA: Diagnosis not present

## 2022-04-08 MED ORDER — INFLUENZA VAC A&B SA ADJ QUAD 0.5 ML IM PRSY
PREFILLED_SYRINGE | INTRAMUSCULAR | 0 refills | Status: DC
  Filled 2022-04-08: qty 0.5, 1d supply, fill #0

## 2022-04-09 ENCOUNTER — Encounter: Payer: Self-pay | Admitting: Internal Medicine

## 2022-04-22 ENCOUNTER — Encounter: Payer: Self-pay | Admitting: Internal Medicine

## 2022-04-24 ENCOUNTER — Other Ambulatory Visit: Payer: Self-pay | Admitting: Obstetrics and Gynecology

## 2022-04-24 DIAGNOSIS — N950 Postmenopausal bleeding: Secondary | ICD-10-CM | POA: Diagnosis not present

## 2022-04-24 DIAGNOSIS — N840 Polyp of corpus uteri: Secondary | ICD-10-CM | POA: Diagnosis not present

## 2022-04-25 ENCOUNTER — Other Ambulatory Visit: Payer: Self-pay | Admitting: Obstetrics and Gynecology

## 2022-04-25 DIAGNOSIS — Z1231 Encounter for screening mammogram for malignant neoplasm of breast: Secondary | ICD-10-CM

## 2022-05-31 DIAGNOSIS — Z124 Encounter for screening for malignant neoplasm of cervix: Secondary | ICD-10-CM | POA: Diagnosis not present

## 2022-05-31 DIAGNOSIS — Z6829 Body mass index (BMI) 29.0-29.9, adult: Secondary | ICD-10-CM | POA: Diagnosis not present

## 2022-05-31 DIAGNOSIS — N959 Unspecified menopausal and perimenopausal disorder: Secondary | ICD-10-CM | POA: Diagnosis not present

## 2022-06-20 ENCOUNTER — Ambulatory Visit
Admission: RE | Admit: 2022-06-20 | Discharge: 2022-06-20 | Disposition: A | Discharge status: Home / Self Care | Payer: Medicare Other | Source: Ambulatory Visit | Attending: Obstetrics and Gynecology | Admitting: Obstetrics and Gynecology

## 2022-06-20 DIAGNOSIS — Z1231 Encounter for screening mammogram for malignant neoplasm of breast: Secondary | ICD-10-CM | POA: Diagnosis not present

## 2022-09-20 ENCOUNTER — Ambulatory Visit: Payer: Medicare Other | Admitting: Family Medicine

## 2022-10-01 ENCOUNTER — Encounter: Payer: Self-pay | Admitting: Family Medicine

## 2022-10-01 ENCOUNTER — Ambulatory Visit (INDEPENDENT_AMBULATORY_CARE_PROVIDER_SITE_OTHER): Payer: Medicare Other | Admitting: Family Medicine

## 2022-10-01 VITALS — BP 122/78 | HR 75 | Temp 97.8°F | Wt 171.2 lb

## 2022-10-01 DIAGNOSIS — E785 Hyperlipidemia, unspecified: Secondary | ICD-10-CM | POA: Diagnosis not present

## 2022-10-01 DIAGNOSIS — F17200 Nicotine dependence, unspecified, uncomplicated: Secondary | ICD-10-CM

## 2022-10-01 DIAGNOSIS — Z8249 Family history of ischemic heart disease and other diseases of the circulatory system: Secondary | ICD-10-CM | POA: Diagnosis not present

## 2022-10-01 DIAGNOSIS — Z86010 Personal history of colonic polyps: Secondary | ICD-10-CM

## 2022-10-01 DIAGNOSIS — Z638 Other specified problems related to primary support group: Secondary | ICD-10-CM

## 2022-10-01 DIAGNOSIS — N951 Menopausal and female climacteric states: Secondary | ICD-10-CM | POA: Diagnosis not present

## 2022-10-01 DIAGNOSIS — J309 Allergic rhinitis, unspecified: Secondary | ICD-10-CM

## 2022-10-01 LAB — LIPID PANEL

## 2022-10-01 NOTE — Patient Instructions (Addendum)
if you are concerned about the granddaughters dog, call animal control Call [employer]

## 2022-10-01 NOTE — Progress Notes (Signed)
Cassandra Irwin is a 72 y.o. female who presents for annual wellness visit and follow-up on chronic medical conditions.  She has underlying allergies.  She sees her gynecologist regularly and continues on HRT.  She is taking atorvastatin and having no difficulty with that.  She does continue to smoke and at this point is not ready to quit.  She has been under a lot of stress dealing with her granddaughter and her dog.  Further discussion indicates that there has been some manipulation going on from the granddaughter that they are trying to deal with.  She does have a history of colonic polyps and is scheduled for routine 5-year follow-up.  Immunizations and Health Maintenance Immunization History  Administered Date(s) Administered   Fluad Quad(high Dose 65+) 04/25/2020   Influenza Split 03/31/2022   Influenza,inj,Quad PF,6+ Mos 03/14/2015, 04/02/2016   Influenza-Unspecified 04/21/2018, 04/02/2019, 04/18/2021   PFIZER Comirnaty(Gray Top)Covid-19 Tri-Sucrose Vaccine 12/01/2020   PFIZER(Purple Top)SARS-COV-2 Vaccination 07/21/2019, 08/10/2019   Pfizer Covid-19 Vaccine Bivalent Booster 18yrs & up 05/28/2021   Pneumococcal Conjugate-13 04/02/2016   Pneumococcal Polysaccharide-23 08/22/2017   Tdap 04/02/2016   Health Maintenance Due  Topic Date Due   Zoster Vaccines- Shingrix (1 of 2) Never done   COVID-19 Vaccine (5 - 2023-24 season) 03/01/2022   Medicare Annual Wellness (AWV)  09/19/2022    Last Pap smear: GYN Last mammogram: 06/20/2022 Last colonoscopy: 12/27/2021 Last DEXA: 03/16/2012 Dentist: within the last year Ophtho: 1 year ago Exercise: walking several times a week  Other doctors caring for patient include: GYN Dr. Corinna Capra  Advanced directives: Yes. Not on file .copy asked for    Depression screen:  See questionnaire below.     10/01/2022   10:47 AM 09/18/2021   10:37 AM 08/31/2020    9:25 AM 08/31/2019   10:15 AM 08/25/2018    1:25 PM  Depression screen PHQ 2/9  Decreased  Interest 0 0 0 0 0  Down, Depressed, Hopeless 0 0 0 0 0  PHQ - 2 Score 0 0 0 0 0    Fall Risk Screen: see questionnaire below.    10/01/2022   10:47 AM 09/18/2021   10:37 AM 08/31/2020    9:25 AM 08/31/2019   10:15 AM 08/25/2018    1:25 PM  [place] in the past year? 0 0 0 0 0  Number falls in past yr: 0 0 0    Injury with Fall? 0 0 0    Risk for fall due to : No Fall Risks No Fall Risks No Fall Risks    Follow up Falls evaluation completed Falls evaluation completed Falls evaluation completed      ADL screen:  See questionnaire below Functional Status Survey: Is the patient deaf or have difficulty hearing?: No Does the patient have difficulty seeing, even when wearing glasses/contacts?: No Does the patient have difficulty concentrating, remembering, or making decisions?: No Does the patient have difficulty walking or climbing stairs?: No Does the patient have difficulty dressing or bathing?: No Does the patient have difficulty doing errands alone such as visiting a doctor's office or shopping?: No   Review of Systems Constitutional: -, -unexpected weight change, -anorexia, -fatigue Allergy: -sneezing, -itching, -congestion Dermatology: denies changing moles, rash, lumps  Cardiology:  -chest pain, -palpitations, -orthopnea, Respiratory: -cough, -shortness of breath, -dyspnea on exertion, -wheezing,  Gastroenterology: -abdominal pain, -nausea, -vomiting, -diarrhea, -constipation, -dysphagia Hematology: -bleeding or bruising problems Musculoskeletal: -arthralgias, -myalgias, -joint swelling, -back pain, - Ophthalmology: -vision changes,  Urology: -dysuria, -difficulty urinating,  -urinary frequency, -urgency, incontinence Neurology: -, -numbness, , -memory loss, -falls, -dizziness    PHYSICAL EXAM:  BP 122/78   Pulse 75   Temp 97.8 F (36.6 C) (Oral)   Wt 171 lb 3.2 oz (77.7 kg)   SpO2 97% Comment: room air  BMI 28.06 kg/m   General Appearance: Alert,  cooperative, no distress, appears stated age Head: Normocephalic, without obvious abnormality, atraumatic Eyes: PERRL, conjunctiva/corneas clear, EOM's intact,  Ears: Normal TM's and external ear canals Nose: Nares normal, mucosa normal, no drainage or sinus tenderness Throat: Lips, mucosa, and tongue normal; teeth and gums normal Neck: Supple, no lymphadenopathy;  thyroid:  no enlargement/tenderness/nodules; no carotid bruit or JVD Lungs: Clear to auscultation bilaterally without wheezes, rales or ronchi; respirations unlabored Heart: Regular rate and rhythm, S1 and S2 normal, no murmur, rubor gallop Skin:  Skin color, texture, turgor normal, no rashes or lesions Lymph nodes: Cervical, supraclavicular, and axillary nodes normal Neurologic:  CNII-XII intact, normal strength, sensation and gait; reflexes 2+ and symmetric throughout Psych: Normal mood, affect, hygiene and grooming.  ASSESSMENT/PLAN: Stress due to family tension  Hot flashes due to menopause  Allergic rhinitis, unspecified seasonality, unspecified trigger  Hyperlipidemia LDL goal <70 - Plan: CBC with Differential/Platelet, Lipid panel  Hx of adenomatous colonic polyps - Plan: CBC with Differential/Platelet  Family history of heart disease - Plan: CBC with Differential/Platelet, Comprehensive metabolic panel, Lipid panel  Current smoker I have a discussion with her concerning enabling is going on with the granddaughter that also involves taking care of the dog.  Strongly encouraged she and her husband to get involved in Al-Anon and also consider calling animal control versus what is happening with the dog. Recommend antihistamine and possibly nasal steroids for her allergies. Did not discuss smoking cessation until the stress of dealing with the granddaughter is under better control.    Immunization recommendations discussed.  Colonoscopy recommendations reviewed Over 45 minutes spent discussing all these issues with  her.  Medicare Attestation I have personally reviewed: The patient's medical and social history Their use of alcohol, tobacco or illicit drugs Their current medications and supplements The patient's functional ability including ADLs,fall risks, home safety risks, cognitive, and hearing and visual impairment Diet and physical activities Evidence for depression or mood disorders  The patient's weight, height, and BMI have been recorded in the chart.  I have made referrals, counseling, and provided education to the patient based on review of the above and I have provided the patient with a written personalized care plan for preventive services.     Jill Alexanders, MD   10/01/2022

## 2022-10-02 LAB — CBC WITH DIFFERENTIAL/PLATELET
Basophils Absolute: 0.1 10*3/uL (ref 0.0–0.2)
Basos: 1 %
EOS (ABSOLUTE): 0.1 10*3/uL (ref 0.0–0.4)
Eos: 2 %
Hematocrit: 47 % — ABNORMAL HIGH (ref 34.0–46.6)
Hemoglobin: 15.9 g/dL (ref 11.1–15.9)
Immature Grans (Abs): 0 10*3/uL (ref 0.0–0.1)
Immature Granulocytes: 0 %
Lymphocytes Absolute: 1.1 10*3/uL (ref 0.7–3.1)
Lymphs: 22 %
MCH: 29.9 pg (ref 26.6–33.0)
MCHC: 33.8 g/dL (ref 31.5–35.7)
MCV: 88 fL (ref 79–97)
Monocytes Absolute: 0.3 10*3/uL (ref 0.1–0.9)
Monocytes: 7 %
Neutrophils Absolute: 3.3 10*3/uL (ref 1.4–7.0)
Neutrophils: 68 %
Platelets: 242 10*3/uL (ref 150–450)
RBC: 5.32 x10E6/uL — ABNORMAL HIGH (ref 3.77–5.28)
RDW: 12.9 % (ref 11.7–15.4)
WBC: 4.8 10*3/uL (ref 3.4–10.8)

## 2022-10-02 LAB — COMPREHENSIVE METABOLIC PANEL
ALT: 10 IU/L (ref 0–32)
AST: 15 IU/L (ref 0–40)
Albumin/Globulin Ratio: 2.2 (ref 1.2–2.2)
Albumin: 4.6 g/dL (ref 3.9–4.9)
Alkaline Phosphatase: 75 IU/L (ref 44–121)
BUN/Creatinine Ratio: 17 (ref 12–28)
BUN: 13 mg/dL (ref 8–27)
Bilirubin Total: 1.2 mg/dL (ref 0.0–1.2)
CO2: 23 mmol/L (ref 20–29)
Calcium: 9.4 mg/dL (ref 8.7–10.3)
Chloride: 101 mmol/L (ref 96–106)
Creatinine, Ser: 0.77 mg/dL (ref 0.57–1.00)
Globulin, Total: 2.1 g/dL (ref 1.5–4.5)
Glucose: 98 mg/dL (ref 70–99)
Potassium: 4.4 mmol/L (ref 3.5–5.2)
Sodium: 139 mmol/L (ref 134–144)
Total Protein: 6.7 g/dL (ref 6.0–8.5)
eGFR: 83 mL/min/{1.73_m2} (ref >59)

## 2022-10-02 LAB — LIPID PANEL
Chol/HDL Ratio: 3.3 ratio (ref 0.0–4.4)
Cholesterol, Total: 172 mg/dL (ref 100–199)
HDL: 52 mg/dL (ref >39)
LDL Chol Calc (NIH): 95 mg/dL (ref 0–99)
Triglycerides: 146 mg/dL (ref 0–149)
VLDL Cholesterol Cal: 25 mg/dL (ref 5–40)

## 2022-10-28 ENCOUNTER — Other Ambulatory Visit: Payer: Self-pay | Admitting: Family Medicine

## 2022-10-28 DIAGNOSIS — E785 Hyperlipidemia, unspecified: Secondary | ICD-10-CM

## 2022-10-28 DIAGNOSIS — Z8249 Family history of ischemic heart disease and other diseases of the circulatory system: Secondary | ICD-10-CM

## 2022-11-13 DIAGNOSIS — H2513 Age-related nuclear cataract, bilateral: Secondary | ICD-10-CM | POA: Diagnosis not present

## 2023-04-18 ENCOUNTER — Other Ambulatory Visit (HOSPITAL_BASED_OUTPATIENT_CLINIC_OR_DEPARTMENT_OTHER): Payer: Self-pay

## 2023-04-18 DIAGNOSIS — Z23 Encounter for immunization: Secondary | ICD-10-CM | POA: Diagnosis not present

## 2023-04-18 MED ORDER — FLUAD 0.5 ML IM SUSY
0.5000 mL | PREFILLED_SYRINGE | Freq: Once | INTRAMUSCULAR | 0 refills | Status: AC
  Filled 2023-04-18: qty 0.5, 1d supply, fill #0

## 2023-05-12 ENCOUNTER — Other Ambulatory Visit: Payer: Self-pay | Admitting: Obstetrics and Gynecology

## 2023-05-12 DIAGNOSIS — Z1231 Encounter for screening mammogram for malignant neoplasm of breast: Secondary | ICD-10-CM

## 2023-06-11 DIAGNOSIS — Z01419 Encounter for gynecological examination (general) (routine) without abnormal findings: Secondary | ICD-10-CM | POA: Diagnosis not present

## 2023-06-11 DIAGNOSIS — Z6825 Body mass index (BMI) 25.0-25.9, adult: Secondary | ICD-10-CM | POA: Diagnosis not present

## 2023-06-11 DIAGNOSIS — N959 Unspecified menopausal and perimenopausal disorder: Secondary | ICD-10-CM | POA: Diagnosis not present

## 2023-06-26 ENCOUNTER — Ambulatory Visit
Admission: RE | Admit: 2023-06-26 | Discharge: 2023-06-26 | Disposition: A | Discharge status: Home / Self Care | Payer: Medicare Other | Source: Ambulatory Visit | Attending: Obstetrics and Gynecology | Admitting: Obstetrics and Gynecology

## 2023-06-26 DIAGNOSIS — Z1231 Encounter for screening mammogram for malignant neoplasm of breast: Secondary | ICD-10-CM

## 2023-08-26 ENCOUNTER — Encounter: Payer: Self-pay | Admitting: Internal Medicine

## 2023-10-14 ENCOUNTER — Ambulatory Visit: Payer: Medicare Other | Admitting: Family Medicine

## 2023-10-14 ENCOUNTER — Encounter: Payer: Self-pay | Admitting: Family Medicine

## 2023-10-14 VITALS — BP 122/82 | HR 70 | Ht 65.0 in | Wt 155.6 lb

## 2023-10-14 DIAGNOSIS — Z0000 Encounter for general adult medical examination without abnormal findings: Secondary | ICD-10-CM

## 2023-10-14 DIAGNOSIS — J301 Allergic rhinitis due to pollen: Secondary | ICD-10-CM | POA: Diagnosis not present

## 2023-10-14 DIAGNOSIS — N951 Menopausal and female climacteric states: Secondary | ICD-10-CM | POA: Diagnosis not present

## 2023-10-14 DIAGNOSIS — Z87891 Personal history of nicotine dependence: Secondary | ICD-10-CM

## 2023-10-14 DIAGNOSIS — Z860101 Personal history of adenomatous and serrated colon polyps: Secondary | ICD-10-CM

## 2023-10-14 DIAGNOSIS — Z8249 Family history of ischemic heart disease and other diseases of the circulatory system: Secondary | ICD-10-CM

## 2023-10-14 DIAGNOSIS — Z122 Encounter for screening for malignant neoplasm of respiratory organs: Secondary | ICD-10-CM | POA: Diagnosis not present

## 2023-10-14 DIAGNOSIS — F17200 Nicotine dependence, unspecified, uncomplicated: Secondary | ICD-10-CM | POA: Diagnosis not present

## 2023-10-14 DIAGNOSIS — E785 Hyperlipidemia, unspecified: Secondary | ICD-10-CM

## 2023-10-14 DIAGNOSIS — E041 Nontoxic single thyroid nodule: Secondary | ICD-10-CM

## 2023-10-14 LAB — CBC WITH DIFFERENTIAL/PLATELET
Basophils Absolute: 0.1 10*3/uL (ref 0.0–0.2)
Basos: 1 %
EOS (ABSOLUTE): 0.1 10*3/uL (ref 0.0–0.4)
Eos: 2 %
Hematocrit: 49 % — ABNORMAL HIGH (ref 34.0–46.6)
Hemoglobin: 16.3 g/dL — ABNORMAL HIGH (ref 11.1–15.9)
Immature Grans (Abs): 0 10*3/uL (ref 0.0–0.1)
Immature Granulocytes: 0 %
Lymphocytes Absolute: 1 10*3/uL (ref 0.7–3.1)
Lymphs: 21 %
MCH: 29.6 pg (ref 26.6–33.0)
MCHC: 33.3 g/dL (ref 31.5–35.7)
MCV: 89 fL (ref 79–97)
Monocytes Absolute: 0.3 10*3/uL (ref 0.1–0.9)
Monocytes: 6 %
Neutrophils Absolute: 3.4 10*3/uL (ref 1.4–7.0)
Neutrophils: 70 %
Platelets: 229 10*3/uL (ref 150–450)
RBC: 5.51 x10E6/uL — ABNORMAL HIGH (ref 3.77–5.28)
RDW: 12.1 % (ref 11.7–15.4)
WBC: 4.8 10*3/uL (ref 3.4–10.8)

## 2023-10-14 LAB — COMPREHENSIVE METABOLIC PANEL WITH GFR
ALT: 8 IU/L (ref 0–32)
AST: 15 IU/L (ref 0–40)
Albumin: 4.6 g/dL (ref 3.8–4.8)
Alkaline Phosphatase: 65 IU/L (ref 44–121)
BUN/Creatinine Ratio: 18 (ref 12–28)
BUN: 13 mg/dL (ref 8–27)
Bilirubin Total: 1.5 mg/dL — ABNORMAL HIGH (ref 0.0–1.2)
CO2: 23 mmol/L (ref 20–29)
Calcium: 9.7 mg/dL (ref 8.7–10.3)
Chloride: 99 mmol/L (ref 96–106)
Creatinine, Ser: 0.74 mg/dL (ref 0.57–1.00)
Globulin, Total: 2.1 g/dL (ref 1.5–4.5)
Glucose: 88 mg/dL (ref 70–99)
Potassium: 4.5 mmol/L (ref 3.5–5.2)
Sodium: 137 mmol/L (ref 134–144)
Total Protein: 6.7 g/dL (ref 6.0–8.5)
eGFR: 86 mL/min/{1.73_m2} (ref >59)

## 2023-10-14 LAB — LIPID PANEL
Chol/HDL Ratio: 3.2 ratio (ref 0.0–4.4)
Cholesterol, Total: 164 mg/dL (ref 100–199)
HDL: 52 mg/dL (ref >39)
LDL Chol Calc (NIH): 85 mg/dL (ref 0–99)
Triglycerides: 154 mg/dL — ABNORMAL HIGH (ref 0–149)
VLDL Cholesterol Cal: 27 mg/dL (ref 5–40)

## 2023-10-14 MED ORDER — ATORVASTATIN CALCIUM 40 MG PO TABS: 40.0000 mg | ORAL_TABLET | Freq: Every day | ORAL | 3 refills | Status: AC

## 2023-10-14 NOTE — Progress Notes (Signed)
 Cassandra Irwin is a 73 y.o. female who presents for annual wellness visit and follow-up on chronic medical conditions.  She has allergies that are giving her some trouble.  She is using Claritin.  She continues on her Vivelle that her gynecologist is giving and plans to continue to take that.  She is also taking Lipitor.  She is having no chest pain, shortness of breath, PND or DOE.  She does have a history of colonic polyps and will be scheduled for another colonoscopy in 2028.  She continues to smoke and is not interested in quitting at the present time.  Immunizations and Health Maintenance Immunization History  Administered Date(s) Administered   Fluad Quad(high Dose 65+) 04/25/2020   Fluad Trivalent(High Dose 65+) 04/18/2023   Influenza Split 03/31/2022   Influenza,inj,Quad PF,6+ Mos 03/14/2015, 04/02/2016   Influenza-Unspecified 04/21/2018, 04/02/2019, 04/18/2021   PFIZER Comirnaty(Gray Top)Covid-19 Tri-Sucrose Vaccine 12/01/2020   PFIZER(Purple Top)SARS-COV-2 Vaccination 07/21/2019, 08/10/2019   Pfizer Covid-19 Vaccine Bivalent Booster 58yrs & up 05/28/2021   Pneumococcal Conjugate-13 04/02/2016   Pneumococcal Polysaccharide-23 08/22/2017   Tdap 04/02/2016   Health Maintenance Due  Topic Date Due   Zoster Vaccines- Shingrix (1 of 2) 08/16/2002   Medicare Annual Wellness (AWV)  09/19/2022   COVID-19 Vaccine (5 - 2024-25 season) 03/02/2023    Last Pap smear: year and half ago. Last mammogram: January 2025 Last colonoscopy: 2 years ago Last DEXA: Dentist: February 2025 Ophtho: 9 months ago  Exercise: walk with dog 15 mins a day   Other doctors caring for patient include: Dr. Asencion Blacksmith OBGYN. Dr. Rachel Budds ophtho.   Advanced directives: Copy ask for Does Patient Have a Medical Advance Directive?: Yes Type of Advance Directive: Healthcare Power of Attorney, Living will, Out of facility DNR (pink MOST or yellow form) Does patient want to make changes to medical advance directive?: Yes  (Inpatient - patient defers changing a medical advance directive at this time - Information given) Copy of Healthcare Power of Attorney in Chart?: No - copy requested  Depression screen:  See questionnaire below.     10/14/2023   10:05 AM 10/01/2022   10:47 AM 09/18/2021   10:37 AM 08/31/2020    9:25 AM 08/31/2019   10:15 AM  Depression screen PHQ 2/9  Decreased Interest 0 0 0 0 0  Down, Depressed, Hopeless 0 0 0 0 0  PHQ - 2 Score 0 0 0 0 0    Fall Risk Screen: see questionnaire below.    10/14/2023   10:05 AM 10/01/2022   10:47 AM 09/18/2021   10:37 AM 08/31/2020    9:25 AM 08/31/2019   10:15 AM  Fall Risk   Falls in the past year? 0 0 0 0 0  Number falls in past yr: 0 0 0 0   Injury with Fall? 0 0 0 0   Risk for fall due to : No Fall Risks No Fall Risks No Fall Risks No Fall Risks   Follow up Falls evaluation completed Falls evaluation completed Falls evaluation completed Falls evaluation completed     ADL screen:  See questionnaire below Functional Status Survey: Is the patient deaf or have difficulty hearing?: No Does the patient have difficulty seeing, even when wearing glasses/contacts?: No Does the patient have difficulty concentrating, remembering, or making decisions?: No Does the patient have difficulty walking or climbing stairs?: No Does the patient have difficulty dressing or bathing?: No Does the patient have difficulty doing errands alone such as visiting a doctor's  office or shopping?: No   Review of Systems Constitutional: -, -unexpected weight change, -anorexia, -fatigue Allergy: -sneezing, -itching, -congestion Dermatology: denies changing moles, rash, lumps ENT: -runny nose, -ear pain, -sore throat,  Cardiology:  -chest pain, -palpitations, -orthopnea, Respiratory: -cough, -shortness of breath, -dyspnea on exertion, -wheezing,  Gastroenterology: -abdominal pain, -nausea, -vomiting, -diarrhea, -constipation, -dysphagia Hematology: -bleeding or bruising  problems Musculoskeletal: -arthralgias, -myalgias, -joint swelling, -back pain, - Ophthalmology: -vision changes,  Urology: -dysuria, -difficulty urinating,  -urinary frequency, -urgency, incontinence Neurology: -, -numbness, , -memory loss, -falls, -dizziness    PHYSICAL EXAM:   General Appearance: Alert, cooperative, no distress, appears stated age Head: Normocephalic, without obvious abnormality, atraumatic Eyes: PERRL, conjunctiva/corneas clear, EOM's intact,  Ears: Normal TM's and external ear canals Nose: Nares normal, mucosa normal, no drainage or sinus tenderness Throat: Lips, mucosa, and tongue normal; teeth and gums normal Neck: Supple, no lymphadenopathy;  thyroid:  no enlargement/tenderness/nodules; no carotid bruit or JVD Lungs: Clear to auscultation bilaterally without wheezes, rales or ronchi; respirations unlabored Heart: Regular rate and rhythm, S1 and S2 normal, no murmur, rubor gallop  Skin:  Skin color, texture, turgor normal, no rashes or lesions Lymph nodes: Cervical, supraclavicular, and axillary nodes normal Neurologic:  CNII-XII intact, normal strength, sensation and gait; reflexes 2+ and symmetric throughout Psych: Normal mood, affect, hygiene and grooming.  ASSESSMENT/PLAN: Seasonal allergic rhinitis due to pollen  Current smoker - Plan: CBC with Differential/Platelet, Comprehensive metabolic panel with GFR, CT CHEST LUNG CA SCREEN LOW DOSE W/O CM  Hx of adenomatous colonic polyps  Hot flashes due to menopause - Plan: CBC with Differential/Platelet, Comprehensive metabolic panel with GFR  Hyperlipidemia LDL goal <70 - Plan: Lipid panel, atorvastatin (LIPITOR) 40 MG tablet  Family history of heart disease - Plan: atorvastatin (LIPITOR) 40 MG tablet  Screening for lung cancer - Plan: CBC with Differential/Platelet, Comprehensive metabolic panel with GFR, CT CHEST LUNG CA SCREEN LOW DOSE W/O CM  Personal history of nicotine dependence - Plan: CT CHEST  LUNG CA SCREEN LOW DOSE W/O CM Recommend adding Rhinocort to her regimen.   Immunization recommendations discussed.  Colonoscopy recommendations reviewed   Medicare Attestation I have personally reviewed: The patient's medical and social history Their use of alcohol, tobacco or illicit drugs Their current medications and supplements The patient's functional ability including ADLs,fall risks, home safety risks, cognitive, and hearing and visual impairment Diet and physical activities Evidence for depression or mood disorders  The patient's weight, height, and BMI have been recorded in the chart.  I have made referrals, counseling, and provided education to the patient based on review of the above and I have provided the patient with a written personalized care plan for preventive services.     Ron Cobbs, MD   10/14/2023

## 2023-10-14 NOTE — Patient Instructions (Addendum)
 Try Rhinocort for your allergies as well as Claritin.  Cassandra Irwin , Thank you for taking time to come for your Medicare Wellness Visit. I appreciate your ongoing commitment to your health goals. Please review the following plan we discussed and let me know if I can assist you in the future.   These are the goals we discussed:  Goals   None     This is a list of the screening recommended for you and due dates:  Health Maintenance  Topic Date Due   Zoster (Shingles) Vaccine (1 of 2) 08/16/2002   COVID-19 Vaccine (5 - 2024-25 season) 03/02/2023   Flu Shot  01/30/2024   Medicare Annual Wellness Visit  10/13/2024   Mammogram  06/25/2025   DTaP/Tdap/Td vaccine (2 - Td or Tdap) 04/02/2026   Colon Cancer Screening  12/28/2031   Pneumonia Vaccine  Completed   DEXA scan (bone density measurement)  Completed   Hepatitis C Screening  Completed   HPV Vaccine  Aged Out   Meningitis B Vaccine  Aged Out

## 2023-10-15 ENCOUNTER — Encounter: Payer: Self-pay | Admitting: Family Medicine

## 2023-10-31 ENCOUNTER — Ambulatory Visit
Admission: RE | Admit: 2023-10-31 | Discharge: 2023-10-31 | Disposition: A | Discharge status: Home / Self Care | Source: Ambulatory Visit | Attending: Family Medicine | Admitting: Family Medicine

## 2023-10-31 DIAGNOSIS — Z122 Encounter for screening for malignant neoplasm of respiratory organs: Secondary | ICD-10-CM

## 2023-10-31 DIAGNOSIS — Z87891 Personal history of nicotine dependence: Secondary | ICD-10-CM

## 2023-10-31 DIAGNOSIS — F17210 Nicotine dependence, cigarettes, uncomplicated: Secondary | ICD-10-CM | POA: Diagnosis not present

## 2023-10-31 DIAGNOSIS — F17200 Nicotine dependence, unspecified, uncomplicated: Secondary | ICD-10-CM

## 2023-11-26 ENCOUNTER — Encounter: Payer: Self-pay | Admitting: Family Medicine

## 2023-11-26 ENCOUNTER — Ambulatory Visit: Payer: Self-pay | Admitting: Family Medicine

## 2023-11-26 NOTE — Addendum Note (Signed)
 Addended by: Watson Hacking on: 11/26/2023 11:52 AM   Modules accepted: Orders

## 2023-11-27 ENCOUNTER — Ambulatory Visit
Admission: RE | Admit: 2023-11-27 | Discharge: 2023-11-27 | Disposition: A | Discharge status: Home / Self Care | Source: Ambulatory Visit | Attending: Family Medicine | Admitting: Family Medicine

## 2023-11-27 DIAGNOSIS — E041 Nontoxic single thyroid nodule: Secondary | ICD-10-CM | POA: Diagnosis not present

## 2023-12-01 NOTE — Addendum Note (Signed)
 Addended by: Watson Hacking on: 12/01/2023 08:11 AM   Modules accepted: Orders

## 2023-12-19 ENCOUNTER — Other Ambulatory Visit (HOSPITAL_COMMUNITY)
Admission: RE | Admit: 2023-12-19 | Discharge: 2023-12-19 | Disposition: A | Discharge status: Home / Self Care | Source: Ambulatory Visit | Attending: Radiology | Admitting: Radiology

## 2023-12-19 ENCOUNTER — Ambulatory Visit
Admission: RE | Admit: 2023-12-19 | Discharge: 2023-12-19 | Disposition: A | Discharge status: Home / Self Care | Source: Ambulatory Visit | Attending: Family Medicine | Admitting: Family Medicine

## 2023-12-19 DIAGNOSIS — E041 Nontoxic single thyroid nodule: Secondary | ICD-10-CM

## 2023-12-19 DIAGNOSIS — E042 Nontoxic multinodular goiter: Secondary | ICD-10-CM | POA: Diagnosis not present

## 2023-12-23 ENCOUNTER — Ambulatory Visit: Payer: Self-pay | Admitting: Family Medicine

## 2023-12-23 DIAGNOSIS — C73 Malignant neoplasm of thyroid gland: Secondary | ICD-10-CM

## 2023-12-23 LAB — CYTOLOGY - NON PAP

## 2024-01-03 DIAGNOSIS — E041 Nontoxic single thyroid nodule: Secondary | ICD-10-CM | POA: Diagnosis not present

## 2024-01-05 ENCOUNTER — Encounter (HOSPITAL_COMMUNITY): Payer: Self-pay

## 2024-01-13 DIAGNOSIS — D440 Neoplasm of uncertain behavior of thyroid gland: Secondary | ICD-10-CM | POA: Diagnosis not present

## 2024-01-13 DIAGNOSIS — E042 Nontoxic multinodular goiter: Secondary | ICD-10-CM | POA: Diagnosis not present

## 2024-01-13 NOTE — Progress Notes (Signed)
 REFERRING PHYSICIAN:  Joyce Norleen Dunnings, *  PROVIDER:  KRYSTAL OZELL SPINNER, MD  MRN: [id number] DOB: 1950-08-25 DATE OF ENCOUNTER: 01/13/2024  Subjective   Chief Complaint: New Consultation ( Suspicious thyroid  nodule)     History of Present Illness:  Patient is referred by Dr. Norleen Joyce for surgical evaluation and management of newly diagnosed thyroid  neoplasm of uncertain behavior.  Patient had undergone a CT scan of the chest as a screening study in May 2025.  Incidental finding was made of thyroid  nodules.  Patient underwent a thyroid  ultrasound on November 30, 2023.  This demonstrated a relatively normal size thyroid  gland with 2 significant nodules in the right thyroid  lobe.  The largest measured 1.8 cm in size and had peripheral calcifications making it moderately suspicious and biopsy was recommended.  Patient underwent fine-needle aspiration biopsy on December 19, 2023.  This showed a Hurthle cell lesion, Bethesda category IV.  Specimen was subsequently submitted for [facility] analysis and returned with a result of suspicious rendering a risk of malignancy of 50%.  There were no significant DNA mutations.  Patient has not had a recent TSH level.  She has never been on thyroid  medication.  She has no prior history of thyroid  disease.  There is no family history of thyroid  disease and specifically no history of thyroid  cancer.  Patient denies any compressive symptoms.  She is accompanied today by her husband, Dr. Daria Ly, a retired cardiologist.   Review of Systems: A complete review of systems was obtained from the patient.  I have reviewed this information and discussed as appropriate with the patient.  See HPI as well for other ROS.  Review of Systems  Constitutional: Negative.   HENT: Negative.    Eyes: Negative.   Respiratory: Negative.    Cardiovascular: Negative.   Gastrointestinal: Negative.   Genitourinary: Negative.   Musculoskeletal: Negative.   Skin: Negative.    Neurological: Negative.   Endo/Heme/Allergies: Negative.   Psychiatric/Behavioral: Negative.        Medical History: Past Medical History:  Diagnosis Date  . Thyroid  disease     Patient Active Problem List  Diagnosis  . Multiple thyroid  nodules  . Neoplasm of uncertain behavior of thyroid  gland    Past Surgical History:  Procedure Laterality Date  . .tubal ligation  1985  . DILATION AND CURETTAGE, DIAGNOSTIC / THERAPEUTIC  1998     No Known Allergies  Current Outpatient Medications on File Prior to Visit  Medication Sig Dispense Refill  . atorvastatin  (LIPITOR) 40 MG tablet Take 40 mg by mouth once daily    . estradiol (DOTTI) patch 0.05 mg/24 hr Place 1 patch onto the skin twice a week    . progesterone (PROMETRIUM) 100 MG capsule Take 100 mg by mouth at bedtime     No current facility-administered medications on file prior to visit.    Family History  Problem Relation Age of Onset  . Coronary Artery Disease (Blocked arteries around heart) Father   . High blood pressure (Hypertension) Father   . Diabetes Father   . Coronary Artery Disease (Blocked arteries around heart) Sister   . Diabetes Sister   . Hyperlipidemia (Elevated cholesterol) Sister   . High blood pressure (Hypertension) Sister      Social History   Tobacco Use  Smoking Status Every Day  . Types: Cigarettes  . Passive exposure: Current  Smokeless Tobacco Never     Social History   Socioeconomic History  . Marital status:  Married  Tobacco Use  . Smoking status: Every Day    Types: Cigarettes    Passive exposure: Current  . Smokeless tobacco: Never  Substance and Sexual Activity  . Alcohol use: Yes    Alcohol/week: 0.0 - 1.0 standard drinks of alcohol  . Drug use: Never   Social Drivers of Corporate investment banker Strain: Low Risk  (10/14/2023)   Received from [facility]   Overall Financial Resource Strain (CARDIA)   . Difficulty of Paying Living Expenses: Not very hard  Food  Insecurity: No Food Insecurity (10/14/2023)   [facility]   Hunger Vital Sign   . Within the past 12 months, you worried that your food would run out before you got the money to buy more.: Never true   . Within the past 12 months, the food you bought just didn't last and you didn't have money to get more.: Never true  Transportation Needs: No Transportation Needs (10/14/2023)   Received from [facility] - Transportation   . Lack of Transportation (Medical): No   . Lack of Transportation (Non-Medical): No  Physical Activity: Insufficiently Active (10/14/2023)   Received from [facility]   Exercise Vital Sign   . On average, how many days per week do you engage in moderate to strenuous exercise (like a brisk walk)?: 4 days   . On average, how many minutes do you engage in exercise at this level?: 30 min  Stress: No Stress Concern Present (10/14/2023)   Received from [facility] - Occupational Stress Questionnaire   . Feeling of Stress : Not at all  Social Connections: Moderately Integrated (10/14/2023)   Received from [facility]   Social Connection and Isolation Panel   . In a typical week, how many times do you talk on the phone with family, friends, or neighbors?: More than three times a week   . How often do you get together with friends or relatives?: Once a week   . How often do you attend church or religious services?: 1 to 4 times per year   . Do you belong to any clubs or organizations such as church groups, unions, fraternal or athletic groups, or school groups?: No   . How often do you attend meetings of the clubs or organizations you belong to?: Never   . Are you married, widowed, divorced, separated, never married, or living with a partner?: Married  Housing Stability: Unknown (01/13/2024)   Housing Stability Vital Sign   . Homeless in the Last Year: No    Objective:    Vitals:   01/13/24 1327 01/13/24 1331   BP: 130/73   Pulse: 73   Temp: 36.9 C (98.4 F)   TempSrc: Oral   SpO2: 97%   Weight: 68.9 kg (152 lb)   Height: 166.4 cm (5' 5.5)   PainSc:  0-No pain    Body mass index is 24.91 kg/m.  Physical Exam   GENERAL APPEARANCE Comfortable, no acute issues Development: normal Gross deformities: none  SKIN Rash, lesions, ulcers: none Induration, erythema: none Nodules: none palpable  EYES Conjunctiva and lids: normal Pupils: equal  EARS, NOSE, MOUTH, THROAT External ears: no lesion or deformity External nose: no lesion or deformity Hearing: grossly normal  NECK Symmetric: yes Trachea: midline Thyroid : Left thyroid  lobe without palpable abnormality.  Right thyroid  lobe with a subtle smooth minimally firm nodule in the mid to lower right thyroid   lobe which is mobile with swallowing and nontender.  There is no associated lymphadenopathy.  CHEST/CV Not assessed  ABDOMEN Not assessed  GENITOURINARY/RECTAL Not assessed  MUSCULOSKELETAL Station and gait: normal Digits and nails: no clubbing or cyanosis Muscle strength: grossly normal all extremities Deformity: none  LYMPHATIC Cervical: none palpable Supraclavicular: none palpable  PSYCHIATRIC Oriented to person, place, and time: yes Mood and affect: normal for situation Judgment and insight: appropriate for situation    Assessment and Plan:  Diagnoses and all orders for this visit:  Multiple thyroid  nodules  Neoplasm of uncertain behavior of thyroid  gland    Patient is referred by Dr. Norleen Jobs for surgical evaluation and recommendations regarding management of a thyroid  neoplasm of uncertain behavior and multiple thyroid  nodules.  Patient provided with a copy of The Thyroid  Book: Medical and Surgical Treatment of Thyroid  Problems, published by [employer], 16 pages.  Book reviewed and explained to patient during visit today.  Today we reviewed her clinical history.  We reviewed her imaging studies.   We discussed options for surgical management.  Patient has a neoplasm of uncertain behavior in the right thyroid  lobe which has a risk of malignancy of 50%.  This needs to be resected for definitive diagnosis and management.  We discussed thyroidectomy versus lobectomy.  We discussed the risk and benefits of each procedure including the risk of recurrent laryngeal nerve injury and injury to parathyroid glands.  We discussed the size and location of the surgical incision.  We discussed the hospital stay to be anticipated.  We discussed the potential need for additional surgery.  We discussed the potential need for lifelong thyroid  hormone replacement.  We discussed the potential need for radioactive iodine treatment.  We discussed postoperative follow-up.  The patient understands and wishes to proceed with surgery in the near future.  As far as a decision regarding lobectomy versus total thyroidectomy, that decision could be made closer to the time of operation.  We will submit orders for scheduling and let the patient and her husband discuss potential dates in the near future.  +++++++++++++++++++++++++++++++++++++++++++++  You are being scheduled for surgery.  You should hear from our office's scheduling department within 3 business days about the location, date, and time of surgery.  We try to make accommodations for patient's preferences in scheduling surgery, but sometimes the Operating Room's schedule or Dr. Ronold schedule prevents us  from making those accommodations.  If you have not heard from our office within 3 business days, call the office and ask for Dr. Ronold nurse [place]).  [facility] OFFICE: [phone]   Krystal Spinner MD [facility] Surgery Office: [phone]

## 2024-01-14 ENCOUNTER — Encounter (HOSPITAL_COMMUNITY): Payer: Self-pay

## 2024-01-14 ENCOUNTER — Ambulatory Visit: Payer: Self-pay | Admitting: Surgery

## 2024-01-14 ENCOUNTER — Encounter (HOSPITAL_COMMUNITY)
Admission: RE | Admit: 2024-01-14 | Discharge: 2024-01-14 | Disposition: A | Discharge status: Home / Self Care | Source: Ambulatory Visit | Attending: Surgery | Admitting: Surgery

## 2024-01-14 ENCOUNTER — Other Ambulatory Visit: Payer: Self-pay

## 2024-01-14 VITALS — BP 119/59 | HR 67 | Temp 98.5°F | Resp 16 | Ht 65.5 in | Wt 150.0 lb

## 2024-01-14 DIAGNOSIS — E785 Hyperlipidemia, unspecified: Secondary | ICD-10-CM | POA: Diagnosis not present

## 2024-01-14 DIAGNOSIS — E042 Nontoxic multinodular goiter: Secondary | ICD-10-CM | POA: Diagnosis not present

## 2024-01-14 DIAGNOSIS — D440 Neoplasm of uncertain behavior of thyroid gland: Secondary | ICD-10-CM | POA: Diagnosis not present

## 2024-01-14 DIAGNOSIS — Z01812 Encounter for preprocedural laboratory examination: Secondary | ICD-10-CM | POA: Diagnosis not present

## 2024-01-14 HISTORY — DX: Nontoxic multinodular goiter: E04.2

## 2024-01-14 HISTORY — DX: Chronic obstructive pulmonary disease, unspecified: J44.9

## 2024-01-14 LAB — CBC
HCT: 46.2 % — ABNORMAL HIGH (ref 36.0–46.0)
Hemoglobin: 15.4 g/dL — ABNORMAL HIGH (ref 12.0–15.0)
MCH: 29.6 pg (ref 26.0–34.0)
MCHC: 33.3 g/dL (ref 30.0–36.0)
MCV: 88.7 fL (ref 80.0–100.0)
Platelets: 190 K/uL (ref 150–400)
RBC: 5.21 MIL/uL — ABNORMAL HIGH (ref 3.87–5.11)
RDW: 12.8 % (ref 11.5–15.5)
WBC: 5.4 K/uL (ref 4.0–10.5)
nRBC: 0 % (ref 0.0–0.2)

## 2024-01-14 NOTE — Patient Instructions (Addendum)
 SURGICAL WAITING ROOM VISITATION  Patients having surgery or a procedure may have no more than 2 support people in the waiting area - these visitors may rotate.    Children under the age of 33 must have an adult with them who is not the patient.  Visitors with respiratory illnesses are discouraged from visiting and should remain at home.  If the patient needs to stay at the hospital during part of their recovery, the visitor guidelines for inpatient rooms apply. Pre-op nurse will coordinate an appropriate time for 1 support person to accompany patient in pre-op.  This support person may not rotate.    Please refer to the [facility] website for the visitor guidelines for Inpatients (after your surgery is over and you are in a regular room).       Your procedure is scheduled on: 01-20-24   Report to [facility] Main Entrance    Report to admitting at       0715  AM   Call this number if you have problems the morning of surgery [phone]   Do not eat food :After Midnight.   After Midnight you may have the following liquids until _0630_____ AM/  DAY OF SURGERY  then nothing by mouth  Water Non-Citrus Juices (without pulp, NO RED-Apple, White grape, White cranberry) Black Coffee (NO MILK/CREAM OR CREAMERS, sugar ok)  Clear Tea (NO MILK/CREAM OR CREAMERS, sugar ok) regular and decaf                             Plain Jell-O (NO RED)                                           Fruit ices (not with fruit pulp, NO RED)                                     Popsicles (NO RED)                                                               Sports drinks like Gatorade (NO RED)                            If you have questions, please contact your surgeon's office.   FOLLOW  ANY ADDITIONAL PRE OP INSTRUCTIONS YOU RECEIVED FROM YOUR SURGEON'S OFFICE!!!     Oral Hygiene is also important to reduce your risk of infection.                                    Remember - BRUSH YOUR  TEETH THE MORNING OF SURGERY WITH YOUR REGULAR TOOTHPASTE  DENTURES WILL BE REMOVED PRIOR TO SURGERY PLEASE DO NOT APPLY Poly grip OR ADHESIVES!!!   Do NOT smoke after Midnight   Stop all vitamins and herbal supplements 7 days before surgery.   Take these medicines the morning of surgery with A SIP OF WATER:  NONE    Bring CPAP mask and tubing day of surgery.                              You may not have any metal on your body including hair pins, jewelry, and body piercing             Do not wear make-up, lotions, powders, perfumes/cologne, or deodorant  Do not wear nail polish including gel and S&S, artificial/acrylic nails, or any other type of covering on natural nails including finger and toenails. If you have artificial nails, gel coating, etc. that needs to be removed by a nail salon please have this removed prior to surgery or surgery may need to be canceled/ delayed if the surgeon/ anesthesia feels like they are unable to be safely monitored.   Do not shave  48 hours prior to surgery.       Do not bring valuables to the hospital. [place] IS NOT             RESPONSIBLE   FOR VALUABLES.   Contacts, glasses, dentures or bridgework may not be worn into surgery.   Bring small overnight bag day of surgery.   DO NOT BRING YOUR HOME MEDICATIONS TO THE HOSPITAL. PHARMACY WILL DISPENSE MEDICATIONS LISTED ON YOUR MEDICATION LIST TO YOU DURING YOUR ADMISSION IN THE HOSPITAL!    Patients discharged on the day of surgery will not be allowed to drive home.  Someone NEEDS to stay with you for the first 24 hours after anesthesia.   Special Instructions: Bring a copy of your healthcare power of attorney and living will documents the day of surgery if you haven't scanned them before.              Please read over the following fact sheets you were given: IF YOU HAVE QUESTIONS ABOUT YOUR PRE-OP INSTRUCTIONS PLEASE CALL [phone]    If you test positive for Covid or have been in  contact with anyone that has tested positive in the last 10 days please notify you surgeon.    [facility] - Preparing for Surgery Before surgery, you can play an important role.  Because skin is not sterile, your skin needs to be as free of germs as possible.  You can reduce the number of germs on your skin by washing with CHG (chlorahexidine gluconate) soap before surgery.  CHG is an antiseptic cleaner which kills germs and bonds with the skin to continue killing germs even after washing. Please DO NOT use if you have an allergy to CHG or antibacterial soaps.  If your skin becomes reddened/irritated stop using the CHG and inform your nurse when you arrive at Short Stay. Do not shave (including legs and underarms) for at least 48 hours prior to the first CHG shower.  You may shave your face/neck. Please follow these instructions carefully:  1.  Shower with CHG Soap the night before surgery and the  morning of Surgery.  2.  If you choose to wash your hair, wash your hair first as usual with your  normal  shampoo.  3.  After you shampoo, rinse your hair and body thoroughly to remove the  shampoo.                            4.  Use CHG as you would any other liquid soap.  You can  apply chg directly  to the skin and wash                       Gently with a scrungie or clean washcloth.  5.  Apply the CHG Soap to your body ONLY FROM THE NECK DOWN.   Do not use on face/ open                           Wound or open sores. Avoid contact with eyes, ears mouth and genitals (private parts).                       Wash face,  Genitals (private parts) with your normal soap.             6.  Wash thoroughly, paying special attention to the area where your surgery  will be performed.  7.  Thoroughly rinse your body with warm water from the neck down.  8.  DO NOT shower/wash with your normal soap after using and rinsing off  the CHG Soap.                9.  Pat yourself dry with a clean towel.            10.  Wear  clean pajamas.            11.  Place clean sheets on your bed the night of your first shower and do not  sleep with pets. Day of Surgery : Do not apply any lotions/deodorants the morning of surgery.  Please wear clean clothes to the hospital/surgery center.  FAILURE TO FOLLOW THESE INSTRUCTIONS MAY RESULT IN THE CANCELLATION OF YOUR SURGERY PATIENT SIGNATURE_________________________________  NURSE SIGNATURE__________________________________  ________________________________________________________________________

## 2024-01-14 NOTE — Progress Notes (Addendum)
 PCP - Norleen Jobs, MD   LOV 10-14-23 epic Cardiologist - no  PPM/ICD -  Device Orders -  Rep Notified -   Chest x-ray - CT chest 11-26-23 epic EKG -  Stress Test -  ECHO -  Cardiac Cath -   Sleep Study -  CPAP -   Fasting Blood Sugar -  Checks Blood Sugar _no____ times a day  Blood Thinner Instructions:n/a Aspirin Instructions:n/a  ERAS Protcol -Y PRE-SURGERY - No drink    COVID vaccine -yes  Activity--Able to climb a flight of stairs with no CP or SOB Anesthesia review:mild COPD   Patient denies shortness of breath, fever, cough and chest pain at PAT appointment   All instructions explained to the patient, with a verbal understanding of the material. Patient agrees to go over the instructions while at home for a better understanding. Patient also instructed to self quarantine after being tested for COVID-19. The opportunity to ask questions was provided.

## 2024-01-18 ENCOUNTER — Encounter (HOSPITAL_COMMUNITY): Payer: Self-pay | Admitting: Surgery

## 2024-01-18 DIAGNOSIS — E042 Nontoxic multinodular goiter: Secondary | ICD-10-CM | POA: Diagnosis present

## 2024-01-18 DIAGNOSIS — D440 Neoplasm of uncertain behavior of thyroid gland: Secondary | ICD-10-CM | POA: Diagnosis present

## 2024-01-18 NOTE — H&P (Signed)
 REFERRING PHYSICIAN: Joyce Norleen Dunnings, MD  PROVIDER: Athleen Feltner OZELL SPINNER, MD   Chief Complaint: New Consultation ( Suspicious thyroid  nodule)  History of Present Illness:  Patient is referred by Dr. Norleen Joyce for surgical evaluation and management of newly diagnosed thyroid  neoplasm of uncertain behavior. Patient had undergone a CT scan of the chest as a screening study in May 2025. Incidental finding was made of thyroid  nodules. Patient underwent a thyroid  ultrasound on November 30, 2023. This demonstrated a relatively normal size thyroid  gland with 2 significant nodules in the right thyroid  lobe. The largest measured 1.8 cm in size and had peripheral calcifications making it moderately suspicious and biopsy was recommended. Patient underwent fine-needle aspiration biopsy on December 19, 2023. This showed a Hurthle cell lesion, Bethesda category IV. Specimen was subsequently submitted for [facility] analysis and returned with a result of suspicious rendering a risk of malignancy of 50%. There were no significant DNA mutations. Patient has not had a recent TSH level. She has never been on thyroid  medication. She has no prior history of thyroid  disease. There is no family history of thyroid  disease and specifically no history of thyroid  cancer. Patient denies any compressive symptoms. She is accompanied today by her husband, Dr. Daria Ly, a retired cardiologist.  Review of Systems: A complete review of systems was obtained from the patient. I have reviewed this information and discussed as appropriate with the patient. See HPI as well for other ROS.  Review of Systems  Constitutional: Negative.  HENT: Negative.  Eyes: Negative.  Respiratory: Negative.  Cardiovascular: Negative.  Gastrointestinal: Negative.  Genitourinary: Negative.  Musculoskeletal: Negative.  Skin: Negative.  Neurological: Negative.  Endo/Heme/Allergies: Negative.  Psychiatric/Behavioral: Negative.    Medical  History: Past Medical History:  Diagnosis Date  Thyroid  disease   Patient Active Problem List  Diagnosis  Multiple thyroid  nodules  Neoplasm of uncertain behavior of thyroid  gland   Past Surgical History:  Procedure Laterality Date  .tubal ligation 1985  DILATION AND CURETTAGE, DIAGNOSTIC / THERAPEUTIC 1998    No Known Allergies  Current Outpatient Medications on File Prior to Visit  Medication Sig Dispense Refill  atorvastatin  (LIPITOR) 40 MG tablet Take 40 mg by mouth once daily  estradiol (DOTTI) patch 0.05 mg/24 hr Place 1 patch onto the skin twice a week  progesterone  (PROMETRIUM ) 100 MG capsule Take 100 mg by mouth at bedtime   No current facility-administered medications on file prior to visit.   Family History  Problem Relation Age of Onset  Coronary Artery Disease (Blocked arteries around heart) Father  High blood pressure (Hypertension) Father  Diabetes Father  Coronary Artery Disease (Blocked arteries around heart) Sister  Diabetes Sister  Hyperlipidemia (Elevated cholesterol) Sister  High blood pressure (Hypertension) Sister    Social History   Tobacco Use  Smoking Status Every Day  Types: Cigarettes  Passive exposure: Current  Smokeless Tobacco Never    Social History   Socioeconomic History  Marital status: Married  Tobacco Use  Smoking status: Every Day  Types: Cigarettes  Passive exposure: Current  Smokeless tobacco: Never  Substance and Sexual Activity  Alcohol use: Yes  Alcohol/week: 0.0 - 1.0 standard drinks of alcohol  Drug use: Never   Social Drivers of Corporate investment banker Strain: Low Risk (10/14/2023)  Received from [facility]  Overall Financial Resource Strain (CARDIA)  Difficulty of Paying Living Expenses: Not very hard  Food Insecurity: No Food Insecurity (10/14/2023)  [facility]  Hunger  Vital Sign  Within the past 12 months, you worried that your food would run out before you got the money to buy  more.: Never true  Within the past 12 months, the food you bought just didn't last and you didn't have money to get more.: Never true  Transportation Needs: No Transportation Needs (10/14/2023)  Received from [facility] - Transportation  Lack of Transportation (Medical): No  Lack of Transportation (Non-Medical): No  Physical Activity: Insufficiently Active (10/14/2023)  Received from [facility]  Exercise Vital Sign  On average, how many days per week do you engage in moderate to strenuous exercise (like a brisk walk)?: 4 days  On average, how many minutes do you engage in exercise at this level?: 30 min  Stress: No Stress Concern Present (10/14/2023)  Received from [facility] - Occupational Stress Questionnaire  Feeling of Stress : Not at all  Social Connections: Moderately Integrated (10/14/2023)  Received from [facility]  Social Connection and Isolation Panel  In a typical week, how many times do you talk on the phone with family, friends, or neighbors?: More than three times a week  How often do you get together with friends or relatives?: Once a week  How often do you attend church or religious services?: 1 to 4 times per year  Do you belong to any clubs or organizations such as church groups, unions, fraternal or athletic groups, or school groups?: No  How often do you attend meetings of the clubs or organizations you belong to?: Never  Are you married, widowed, divorced, separated, never married, or living with a partner?: Married  Housing Stability: Unknown (01/13/2024)  Housing Stability Vital Sign  Homeless in the Last Year: No   Objective:   Vitals:  BP: 130/73  Pulse: 73  Temp: 36.9 C (98.4 F)  TempSrc: Oral  SpO2: 97%  Weight: 68.9 kg (152 lb)  Height: 166.4 cm (5' 5.5)  PainSc: 0-No pain   Body mass index is 24.91 kg/m.  Physical Exam   GENERAL APPEARANCE Comfortable, no acute issues Development:  normal Gross deformities: none  SKIN Rash, lesions, ulcers: none Induration, erythema: none Nodules: none palpable  EYES Conjunctiva and lids: normal Pupils: equal  EARS, NOSE, MOUTH, THROAT External ears: no lesion or deformity External nose: no lesion or deformity Hearing: grossly normal  NECK Symmetric: yes Trachea: midline Thyroid : Left thyroid  lobe without palpable abnormality. Right thyroid  lobe with a subtle smooth minimally firm nodule in the mid to lower right thyroid  lobe which is mobile with swallowing and nontender. There is no associated lymphadenopathy.  CHEST/CV Not assessed  ABDOMEN Not assessed  GENITOURINARY/RECTAL Not assessed  MUSCULOSKELETAL Station and gait: normal Digits and nails: no clubbing or cyanosis Muscle strength: grossly normal all extremities Deformity: none  LYMPHATIC Cervical: none palpable Supraclavicular: none palpable  PSYCHIATRIC Oriented to person, place, and time: yes Mood and affect: normal for situation Judgment and insight: appropriate for situation   Assessment and Plan:   Multiple thyroid  nodules Neoplasm of uncertain behavior of thyroid  gland  Patient is referred by Dr. Norleen Jobs for surgical evaluation and recommendations regarding management of a thyroid  neoplasm of uncertain behavior and multiple thyroid  nodules.  Patient provided with a copy of The Thyroid  Book: Medical and Surgical Treatment of Thyroid  Problems, published by [employer], 16 pages. Book reviewed and explained to patient during visit today.  Today we reviewed her clinical history. We reviewed her imaging studies. We  discussed options for surgical management. Patient has a neoplasm of uncertain behavior in the right thyroid  lobe which has a risk of malignancy of 50%. This needs to be resected for definitive diagnosis and management. We discussed thyroidectomy versus lobectomy. We discussed the risk and benefits of each procedure including the  risk of recurrent laryngeal nerve injury and injury to parathyroid glands. We discussed the size and location of the surgical incision. We discussed the hospital stay to be anticipated. We discussed the potential need for additional surgery. We discussed the potential need for lifelong thyroid  hormone replacement. We discussed the potential need for radioactive iodine treatment. We discussed postoperative follow-up. The patient understands and wishes to proceed with surgery in the near future. As far as a decision regarding lobectomy versus total thyroidectomy, that decision could be made closer to the time of operation.  We will submit orders for scheduling and let the patient and her husband discuss potential dates in the near future.  Krystal Spinner, MD [facility] practice Office: [phone]

## 2024-01-19 NOTE — Anesthesia Preprocedure Evaluation (Signed)
 Anesthesia Evaluation  Patient identified by MRN, date of birth, ID band Patient awake    Reviewed: Allergy & Precautions, NPO status , Patient's Chart, lab work & pertinent test results  History of Anesthesia Complications Negative for: history of anesthetic complications  Airway Mallampati: III  TM Distance: >3 FB Neck ROM: Full    Dental  (+) Dental Advisory Given,    Pulmonary neg shortness of breath, neg sleep apnea, COPD, neg recent URI, Current Smoker and Patient abstained from smoking.   Pulmonary exam normal breath sounds clear to auscultation       Cardiovascular (-) hypertension(-) angina (-) Past MI, (-) Cardiac Stents and (-) CABG (-) dysrhythmias  Rhythm:Regular Rate:Normal  HLD   Neuro/Psych negative neurological ROS     GI/Hepatic negative GI ROS, Neg liver ROS,,,  Endo/Other  neg diabetes  Multiple thyroid  nodules, thyroid  cancer  Renal/GU negative Renal ROS     Musculoskeletal   Abdominal   Peds  Hematology negative hematology ROS (+) Lab Results      Component                Value               Date                      WBC                      5.4                 01/14/2024                HGB                      15.4 (H)            01/14/2024                HCT                      46.2 (H)            01/14/2024                MCV                      88.7                01/14/2024                PLT                      190                 01/14/2024              Anesthesia Other Findings   Reproductive/Obstetrics                              Anesthesia Physical Anesthesia Plan  ASA: 2  Anesthesia Plan: General   Post-op Pain Management: Tylenol  PO (pre-op)*   Induction: Intravenous  PONV Risk Score and Plan: 2 and Ondansetron , Dexamethasone  and Treatment may vary due to age or medical condition  Airway Management Planned: Oral ETT  Additional Equipment:    Intra-op Plan:   Post-operative Plan: Extubation in OR  Informed Consent: I have  reviewed the patients History and Physical, chart, labs and discussed the procedure including the risks, benefits and alternatives for the proposed anesthesia with the patient or authorized representative who has indicated his/her understanding and acceptance.     Dental advisory given  Plan Discussed with: CRNA and Anesthesiologist  Anesthesia Plan Comments: (Risks of general anesthesia discussed including, but not limited to, sore throat, hoarse voice, chipped/damaged teeth, injury to vocal cords, nausea and vomiting, allergic reactions, lung infection, heart attack, stroke, and death. All questions answered. )         Anesthesia Quick Evaluation

## 2024-01-20 ENCOUNTER — Other Ambulatory Visit: Payer: Self-pay

## 2024-01-20 ENCOUNTER — Encounter (HOSPITAL_COMMUNITY): Payer: Self-pay | Admitting: Surgery

## 2024-01-20 ENCOUNTER — Encounter (HOSPITAL_COMMUNITY): Payer: Self-pay | Admitting: Anesthesiology

## 2024-01-20 ENCOUNTER — Encounter (HOSPITAL_COMMUNITY)
Admission: RE | Disposition: A | Discharge status: Home / Self Care | Payer: Self-pay | Source: Home / Self Care | Attending: Surgery

## 2024-01-20 ENCOUNTER — Ambulatory Visit (HOSPITAL_COMMUNITY)
Admission: RE | Admit: 2024-01-20 | Discharge: 2024-01-21 | Disposition: A | Discharge status: Home / Self Care | Attending: Surgery | Admitting: Surgery

## 2024-01-20 ENCOUNTER — Ambulatory Visit (HOSPITAL_BASED_OUTPATIENT_CLINIC_OR_DEPARTMENT_OTHER): Payer: Self-pay | Admitting: Anesthesiology

## 2024-01-20 DIAGNOSIS — E042 Nontoxic multinodular goiter: Secondary | ICD-10-CM | POA: Insufficient documentation

## 2024-01-20 DIAGNOSIS — J449 Chronic obstructive pulmonary disease, unspecified: Secondary | ICD-10-CM | POA: Insufficient documentation

## 2024-01-20 DIAGNOSIS — F17210 Nicotine dependence, cigarettes, uncomplicated: Secondary | ICD-10-CM | POA: Diagnosis not present

## 2024-01-20 DIAGNOSIS — D440 Neoplasm of uncertain behavior of thyroid gland: Secondary | ICD-10-CM

## 2024-01-20 HISTORY — PX: THYROID LOBECTOMY: SHX420

## 2024-01-20 SURGERY — LOBECTOMY, THYROID
Anesthesia: General | Site: Neck

## 2024-01-20 MED ORDER — ONDANSETRON HCL 4 MG/2ML IJ SOLN
INTRAMUSCULAR | Status: DC | PRN
  Administered 2024-01-20: 4 mg via INTRAVENOUS

## 2024-01-20 MED ORDER — PROPOFOL 10 MG/ML IV BOLUS
INTRAVENOUS | Status: AC
Start: 2024-01-20 — End: 2024-01-20
  Filled 2024-01-20: qty 20

## 2024-01-20 MED ORDER — ACETAMINOPHEN 500 MG PO TABS
1000.0000 mg | ORAL_TABLET | Freq: Once | ORAL | Status: AC
  Administered 2024-01-20: 1000 mg via ORAL
  Filled 2024-01-20: qty 2

## 2024-01-20 MED ORDER — 0.9 % SODIUM CHLORIDE (POUR BTL) OPTIME
TOPICAL | Status: DC | PRN
  Administered 2024-01-20: 1000 mL

## 2024-01-20 MED ORDER — AMISULPRIDE (ANTIEMETIC) 5 MG/2ML IV SOLN
10.0000 mg | Freq: Once | INTRAVENOUS | Status: AC | PRN
  Administered 2024-01-20: 10 mg via INTRAVENOUS

## 2024-01-20 MED ORDER — FENTANYL CITRATE (PF) 100 MCG/2ML IJ SOLN
INTRAMUSCULAR | Status: AC
Start: 2024-01-20 — End: 2024-01-20
  Filled 2024-01-20: qty 2

## 2024-01-20 MED ORDER — SUGAMMADEX SODIUM 200 MG/2ML IV SOLN
INTRAVENOUS | Status: DC | PRN
  Administered 2024-01-20 (×2): 100 mg via INTRAVENOUS

## 2024-01-20 MED ORDER — OXYCODONE HCL 5 MG PO TABS
5.0000 mg | ORAL_TABLET | Freq: Once | ORAL | Status: AC | PRN
  Administered 2024-01-20: 5 mg via ORAL

## 2024-01-20 MED ORDER — CHLORHEXIDINE GLUCONATE CLOTH 2 % EX PADS: 6.0000 | MEDICATED_PAD | Freq: Once | CUTANEOUS | Status: DC

## 2024-01-20 MED ORDER — HYDROMORPHONE HCL 1 MG/ML IJ SOLN: 1.0000 mg | INTRAMUSCULAR | Status: DC | PRN

## 2024-01-20 MED ORDER — ONDANSETRON HCL 4 MG/2ML IJ SOLN
INTRAMUSCULAR | Status: AC
  Filled 2024-01-20: qty 2

## 2024-01-20 MED ORDER — PHENYLEPHRINE 80 MCG/ML (10ML) SYRINGE FOR IV PUSH (FOR BLOOD PRESSURE SUPPORT)
PREFILLED_SYRINGE | INTRAVENOUS | Status: DC | PRN
  Administered 2024-01-20 (×2): 80 ug via INTRAVENOUS

## 2024-01-20 MED ORDER — OXYCODONE HCL 5 MG PO TABS
ORAL_TABLET | ORAL | Status: AC
  Filled 2024-01-20: qty 1

## 2024-01-20 MED ORDER — LIDOCAINE HCL (PF) 2 % IJ SOLN
INTRAMUSCULAR | Status: AC
  Filled 2024-01-20: qty 5

## 2024-01-20 MED ORDER — FENTANYL CITRATE PF 50 MCG/ML IJ SOSY
PREFILLED_SYRINGE | INTRAMUSCULAR | Status: AC
  Filled 2024-01-20: qty 1

## 2024-01-20 MED ORDER — ALBUTEROL SULFATE HFA 108 (90 BASE) MCG/ACT IN AERS
INHALATION_SPRAY | RESPIRATORY_TRACT | Status: AC
  Filled 2024-01-20: qty 6.7

## 2024-01-20 MED ORDER — LACTATED RINGERS IV SOLN: INTRAVENOUS | Status: DC

## 2024-01-20 MED ORDER — SUGAMMADEX SODIUM 200 MG/2ML IV SOLN
INTRAVENOUS | Status: AC
  Filled 2024-01-20: qty 2

## 2024-01-20 MED ORDER — FENTANYL CITRATE (PF) 100 MCG/2ML IJ SOLN
INTRAMUSCULAR | Status: DC | PRN
  Administered 2024-01-20 (×2): 50 ug via INTRAVENOUS

## 2024-01-20 MED ORDER — ONDANSETRON 4 MG PO TBDP: 4.0000 mg | ORAL_TABLET | Freq: Four times a day (QID) | ORAL | Status: DC | PRN

## 2024-01-20 MED ORDER — MIDAZOLAM HCL 2 MG/2ML IJ SOLN
INTRAMUSCULAR | Status: AC
  Filled 2024-01-20: qty 2

## 2024-01-20 MED ORDER — EPHEDRINE 5 MG/ML INJ
INTRAVENOUS | Status: AC
  Filled 2024-01-20: qty 5

## 2024-01-20 MED ORDER — OXYCODONE HCL 5 MG PO TABS
5.0000 mg | ORAL_TABLET | ORAL | Status: DC | PRN
  Filled 2024-01-20: qty 2

## 2024-01-20 MED ORDER — EPHEDRINE 5 MG/ML INJ
INTRAVENOUS | Status: AC
Start: 2024-01-20 — End: 2024-01-20
  Filled 2024-01-20: qty 5

## 2024-01-20 MED ORDER — PHENYLEPHRINE HCL (PRESSORS) 10 MG/ML IV SOLN
INTRAVENOUS | Status: AC
  Filled 2024-01-20: qty 1

## 2024-01-20 MED ORDER — FENTANYL CITRATE PF 50 MCG/ML IJ SOSY
25.0000 ug | PREFILLED_SYRINGE | INTRAMUSCULAR | Status: DC | PRN
  Administered 2024-01-20: 50 ug via INTRAVENOUS

## 2024-01-20 MED ORDER — MIDAZOLAM HCL 5 MG/5ML IJ SOLN
INTRAMUSCULAR | Status: DC | PRN
  Administered 2024-01-20: 1 mg via INTRAVENOUS

## 2024-01-20 MED ORDER — TRAMADOL HCL 50 MG PO TABS
50.0000 mg | ORAL_TABLET | Freq: Four times a day (QID) | ORAL | Status: DC | PRN
  Administered 2024-01-20: 50 mg via ORAL
  Filled 2024-01-20: qty 1

## 2024-01-20 MED ORDER — PROGESTERONE MICRONIZED 100 MG PO CAPS
100.0000 mg | ORAL_CAPSULE | Freq: Every evening | ORAL | Status: DC
Start: 2024-01-20 — End: 2024-01-21
  Administered 2024-01-20: 100 mg via ORAL
  Filled 2024-01-20: qty 1

## 2024-01-20 MED ORDER — PHENYLEPHRINE HCL-NACL 20-0.9 MG/250ML-% IV SOLN
INTRAVENOUS | Status: DC | PRN
  Administered 2024-01-20: 10 ug/min via INTRAVENOUS

## 2024-01-20 MED ORDER — PROPOFOL 10 MG/ML IV BOLUS
INTRAVENOUS | Status: DC | PRN
  Administered 2024-01-20: 50 mg via INTRAVENOUS
  Administered 2024-01-20: 150 mg via INTRAVENOUS

## 2024-01-20 MED ORDER — STERILE WATER FOR IRRIGATION IR SOLN
Status: DC | PRN
  Administered 2024-01-20: 1000 mL

## 2024-01-20 MED ORDER — ACETAMINOPHEN 650 MG RE SUPP: 650.0000 mg | Freq: Four times a day (QID) | RECTAL | Status: DC | PRN

## 2024-01-20 MED ORDER — AMISULPRIDE (ANTIEMETIC) 5 MG/2ML IV SOLN
INTRAVENOUS | Status: AC
  Filled 2024-01-20: qty 4

## 2024-01-20 MED ORDER — HEMOSTATIC AGENTS (NO CHARGE) OPTIME
TOPICAL | Status: DC | PRN
  Administered 2024-01-20: 1 via TOPICAL

## 2024-01-20 MED ORDER — CHLORHEXIDINE GLUCONATE 0.12 % MT SOLN
15.0000 mL | Freq: Once | OROMUCOSAL | Status: AC
  Administered 2024-01-20: 15 mL via OROMUCOSAL

## 2024-01-20 MED ORDER — ONDANSETRON HCL 4 MG/2ML IJ SOLN: 4.0000 mg | Freq: Four times a day (QID) | INTRAMUSCULAR | Status: DC | PRN

## 2024-01-20 MED ORDER — SODIUM CHLORIDE 0.45 % IV SOLN: INTRAVENOUS | Status: DC

## 2024-01-20 MED ORDER — GLYCOPYRROLATE 0.2 MG/ML IJ SOLN
INTRAMUSCULAR | Status: AC
  Filled 2024-01-20: qty 1

## 2024-01-20 MED ORDER — OXYCODONE HCL 5 MG/5ML PO SOLN: 5.0000 mg | Freq: Once | ORAL | Status: AC | PRN

## 2024-01-20 MED ORDER — ACETAMINOPHEN 325 MG PO TABS: 650.0000 mg | ORAL_TABLET | Freq: Four times a day (QID) | ORAL | Status: DC | PRN

## 2024-01-20 MED ORDER — ROCURONIUM BROMIDE 10 MG/ML (PF) SYRINGE
PREFILLED_SYRINGE | INTRAVENOUS | Status: AC
  Filled 2024-01-20: qty 10

## 2024-01-20 MED ORDER — DEXAMETHASONE SODIUM PHOSPHATE 10 MG/ML IJ SOLN
INTRAMUSCULAR | Status: DC | PRN
  Administered 2024-01-20: 8 mg via INTRAVENOUS

## 2024-01-20 MED ORDER — PHENYLEPHRINE 80 MCG/ML (10ML) SYRINGE FOR IV PUSH (FOR BLOOD PRESSURE SUPPORT)
PREFILLED_SYRINGE | INTRAVENOUS | Status: AC
Start: 2024-01-20 — End: 2024-01-20
  Filled 2024-01-20: qty 10

## 2024-01-20 MED ORDER — ROCURONIUM BROMIDE 100 MG/10ML IV SOLN
INTRAVENOUS | Status: DC | PRN
Start: 2024-01-20 — End: 2024-01-20
  Administered 2024-01-20: 50 mg via INTRAVENOUS

## 2024-01-20 MED ORDER — DEXAMETHASONE SODIUM PHOSPHATE 10 MG/ML IJ SOLN
INTRAMUSCULAR | Status: AC
  Filled 2024-01-20: qty 1

## 2024-01-20 MED ORDER — ORAL CARE MOUTH RINSE: 15.0000 mL | Freq: Once | OROMUCOSAL | Status: AC

## 2024-01-20 MED ORDER — CEFAZOLIN SODIUM-DEXTROSE 2-4 GM/100ML-% IV SOLN
2.0000 g | INTRAVENOUS | Status: AC
  Administered 2024-01-20: 2 g via INTRAVENOUS
  Filled 2024-01-20: qty 100

## 2024-01-20 MED ORDER — LIDOCAINE HCL (CARDIAC) PF 100 MG/5ML IV SOSY
PREFILLED_SYRINGE | INTRAVENOUS | Status: DC | PRN
  Administered 2024-01-20: 150 mg via INTRAVENOUS
  Administered 2024-01-20: 100 mg via INTRAVENOUS

## 2024-01-20 MED ORDER — EPHEDRINE SULFATE-NACL 50-0.9 MG/10ML-% IV SOSY
PREFILLED_SYRINGE | INTRAVENOUS | Status: DC | PRN
  Administered 2024-01-20: 5 mg via INTRAVENOUS
  Administered 2024-01-20: 10 mg via INTRAVENOUS

## 2024-01-20 SURGICAL SUPPLY — 27 items
BAG COUNTER SPONGE SURGICOUNT (BAG) ×1 IMPLANT
BLADE SURG 15 STRL LF DISP TIS (BLADE) ×2 IMPLANT
CHLORAPREP W/TINT 26 (MISCELLANEOUS) ×2 IMPLANT
CLIP TI MEDIUM 6 (CLIP) ×4 IMPLANT
CLIP TI WIDE RED SMALL 6 (CLIP) ×5 IMPLANT
COVER SURGICAL LIGHT HANDLE (MISCELLANEOUS) ×2 IMPLANT
DERMABOND ADVANCED .7 DNX12 (GAUZE/BANDAGES/DRESSINGS) ×2 IMPLANT
DRAPE LAPAROTOMY T 98X78 PEDS (DRAPES) ×2 IMPLANT
DRAPE UTILITY XL STRL (DRAPES) ×2 IMPLANT
ELECT PENCIL ROCKER SW 15FT (MISCELLANEOUS) ×2 IMPLANT
ELECT REM PT RETURN 15FT ADLT (MISCELLANEOUS) ×2 IMPLANT
GAUZE 4X4 16PLY ~~LOC~~+RFID DBL (SPONGE) ×2 IMPLANT
GLOVE SURG ORTHO 8.0 STRL STRW (GLOVE) ×2 IMPLANT
GOWN STRL REUS W/ TWL XL LVL3 (GOWN DISPOSABLE) ×4 IMPLANT
HEMOSTAT SURGICEL 2X4 FIBR (HEMOSTASIS) ×2 IMPLANT
ILLUMINATOR WAVEGUIDE N/F (MISCELLANEOUS) ×2 IMPLANT
KIT BASIN OR (CUSTOM PROCEDURE TRAY) ×2 IMPLANT
KIT TURNOVER KIT A (KITS) ×2 IMPLANT
PACK BASIC VI WITH GOWN DISP (CUSTOM PROCEDURE TRAY) ×2 IMPLANT
PAD MAGNETIC INSTR ST 16X20 (MISCELLANEOUS) ×2 IMPLANT
SHEARS HARMONIC 9CM CVD (BLADE) ×2 IMPLANT
SUT MNCRL AB 4-0 PS2 18 (SUTURE) ×2 IMPLANT
SUT SILK 3 0 SH 30 (SUTURE) ×1 IMPLANT
SUT VIC AB 3-0 SH 18 (SUTURE) ×4 IMPLANT
SYR BULB IRRIG 60ML STRL (SYRINGE) ×2 IMPLANT
TOWEL OR 17X26 10 PK STRL BLUE (TOWEL DISPOSABLE) ×1 IMPLANT
TUBING CONNECTING 10 (TUBING) ×2 IMPLANT

## 2024-01-20 NOTE — Discharge Instructions (Signed)

## 2024-01-20 NOTE — Anesthesia Postprocedure Evaluation (Signed)
 Anesthesia Post Note  Patient: Cassandra Irwin  Procedure(s) Performed: LOBECTOMY, THYROID  (Neck)     Patient location during evaluation: PACU Anesthesia Type: General Level of consciousness: awake Pain management: pain level controlled Vital Signs Assessment: post-procedure vital signs reviewed and stable Respiratory status: spontaneous breathing, nonlabored ventilation and respiratory function stable Cardiovascular status: blood pressure returned to baseline and stable Postop Assessment: no apparent nausea or vomiting Anesthetic complications: no   No notable events documented.  Last Vitals:  Vitals:   01/20/24 1200 01/20/24 1227  BP: (!) 127/53 124/68  Pulse: 69 72  Resp: 13 12  Temp:    SpO2: 93% 98%    Last Pain:  Vitals:   01/20/24 1226  TempSrc:   PainSc: 2                  Delon Aisha Arch

## 2024-01-20 NOTE — Anesthesia Procedure Notes (Signed)
 Procedure Name: Intubation Date/Time: 01/20/2024 9:34 AM  Performed by: Nada Corean CROME, CRNAPre-anesthesia Checklist: Suction available, Emergency Drugs available, Patient identified, Patient being monitored and Timeout performed Patient Re-evaluated:Patient Re-evaluated prior to induction Oxygen Delivery Method: Circle system utilized Preoxygenation: Pre-oxygenation with 100% oxygen Induction Type: IV induction Ventilation: Mask ventilation without difficulty Laryngoscope Size: Mac and 3 Grade View: Grade II Tube type: Oral Tube size: 7.0 mm Number of attempts: 1 Airway Equipment and Method: Stylet Placement Confirmation: ETT inserted through vocal cords under direct vision, positive ETCO2 and breath sounds checked- equal and bilateral Secured at: 22 cm Tube secured with: Tape Dental Injury: Teeth and Oropharynx as per pre-operative assessment

## 2024-01-20 NOTE — Interval H&P Note (Signed)
 History and Physical Interval Note:  01/20/2024 9:05 AM  Cassandra Irwin  has presented today for surgery, with the diagnosis of THYROID  NEOPLASM OF UNCERTAIN BEHAVIOR  MULTIPLE THYROID  NODULES.  The various methods of treatment have been discussed with the patient and family. After consideration of risks, benefits and other options for treatment, the patient has consented to    Procedure(s) with comments: THYROIDECTOMY (N/A) - TOTAL THYROIDECTOMY as a surgical intervention.    The patient's history has been reviewed, patient examined, no change in status, stable for surgery.  I have reviewed the patient's chart and labs.  Questions were answered to the patient's satisfaction.    Krystal Spinner, MD [facility] practice Office: [phone]   Krystal Spinner

## 2024-01-20 NOTE — Op Note (Signed)
 Procedure Note  Pre-operative Diagnosis:  thyroid  neoplasm of uncertain behavior, multiple thyroid  nodules  Post-operative Diagnosis:  same  Surgeon:  Krystal Spinner, MD  Assistant:  Odelia Flock, MD ([facility] Resident)   Procedure:  Right thyroid  lobectomy and isthmusectomy  Anesthesia:  General  Estimated Blood Loss:  minimal  Drains: none         Specimen: thyroid  lobe to pathology  Indications:  Patient is referred by Dr. Norleen Jobs for surgical evaluation and management of newly diagnosed thyroid  neoplasm of uncertain behavior. Patient had undergone a CT scan of the chest as a screening study in May 2025. Incidental finding was made of thyroid  nodules. Patient underwent a thyroid  ultrasound on November 30, 2023. This demonstrated a relatively normal size thyroid  gland with 2 significant nodules in the right thyroid  lobe. The largest measured 1.8 cm in size and had peripheral calcifications making it moderately suspicious and biopsy was recommended. Patient underwent fine-needle aspiration biopsy on December 19, 2023. This showed a Hurthle cell lesion, Bethesda category IV. Specimen was subsequently submitted for [facility] analysis and returned with a result of suspicious rendering a risk of malignancy of 50%. There were no significant DNA mutations. Patient has not had a recent TSH level. She has never been on thyroid  medication. She has no prior history of thyroid  disease. There is no family history of thyroid  disease and specifically no history of thyroid  cancer. Patient denies any compressive symptoms. She is accompanied today by her husband, Dr. Daria Ly, a retired cardiologist.   Procedure Details: Procedure was done in OR #2 at the [facility]. The patient was brought to the operating room and placed in a supine position on the operating room table. Following administration of general anesthesia, the patient was positioned and then prepped and draped in the usual aseptic fashion. After  ascertaining that an adequate level of anesthesia had been achieved, a small Kocher incision was made with #15 blade. Dissection was carried through subcutaneous tissues and platysma. Hemostasis was achieved with the electrocautery. Skin flaps were elevated cephalad and caudad from the thyroid  notch to the sternal notch. A self-retaining retractor was placed for exposure. Strap muscles were incised in the midline and dissection was begun on the  right side. Strap muscles were reflected laterally. The right thyroid  lobe was normal in size with a 2 cm nodule located just inferior to the mid pole. The lobe was gently mobilized with blunt dissection. Superior pole vessels were dissected out and divided individually between small and medium ligaclips with the harmonic scalpel. The thyroid  lobe was rolled anteriorly. Branches of the inferior thyroid  artery were divided between small ligaclips with the harmonic scalpel. Inferior venous tributaries were divided between ligaclips. Both the superior and inferior parathyroid glands were identified and preserved on their vascular pedicles. The recurrent laryngeal nerve was identified and preserved along its course. The ligament of Court was released with the electrocautery and the gland was mobilized onto the anterior trachea. The nodule did extend to near the capsule but did not appear to involve the underlying trachea and was mobilized off of the trachea with the electrocautery. Isthmus was mobilized across the midline. There was no significant pyramidal lobe present. The thyroid  parenchyma was transected at the junction of the isthmus and contralateral thyroid  lobe with the harmonic scalpel. A suture was used to mark the isthmus margin. The thyroid  lobe and isthmus were submitted to pathology for review.  The entire field was palpated for evidence of lymphadenopathy or extra-thyroidal  disease.  No worrisome findings were noted.  No enlarged lymph nodes were  identified.  The neck was irrigated with warm saline. Fibrillar was placed throughout the operative field. Strap muscles were approximated in the midline with interrupted 3-0 Vicryl sutures. Platysma was closed with interrupted 3-0 Vicryl sutures. Skin was closed with a running 4-0 Monocryl subcuticular suture.  Wound was washed and dried and Dermabond was applied. The patient was awakened from anesthesia and brought to the recovery room. The patient tolerated the procedure well.   Krystal Spinner, MD [facility] Office: [phone]

## 2024-01-20 NOTE — Transfer of Care (Signed)
 Immediate Anesthesia Transfer of Care Note  Patient: Cassandra Irwin  Procedure(s) Performed: LOBECTOMY, THYROID  (Neck)  Patient Location: PACU  Anesthesia Type:General  Level of Consciousness: awake, alert , oriented, and patient cooperative  Airway & Oxygen Therapy: Patient Spontanous Breathing and Patient connected to face mask oxygen  Post-op Assessment: Report given to RN and Post -op Vital signs reviewed and stable  Post vital signs: Reviewed and stable  Last Vitals:  Vitals Value Taken Time  BP 118/48 01/20/24 11:10  Temp    Pulse 75 01/20/24 11:12  Resp 17 01/20/24 11:12  SpO2 100 % 01/20/24 11:12  Vitals shown include unfiled device data.  Last Pain:  Vitals:   01/20/24 0810  TempSrc:   PainSc: 0-No pain      Patients Stated Pain Goal: 4 (01/20/24 0804)  Complications: No notable events documented.

## 2024-01-21 ENCOUNTER — Encounter (HOSPITAL_COMMUNITY): Payer: Self-pay | Admitting: Surgery

## 2024-01-21 DIAGNOSIS — E042 Nontoxic multinodular goiter: Secondary | ICD-10-CM | POA: Diagnosis not present

## 2024-01-21 DIAGNOSIS — J449 Chronic obstructive pulmonary disease, unspecified: Secondary | ICD-10-CM | POA: Diagnosis not present

## 2024-01-21 DIAGNOSIS — F17210 Nicotine dependence, cigarettes, uncomplicated: Secondary | ICD-10-CM | POA: Diagnosis not present

## 2024-01-21 LAB — SURGICAL PATHOLOGY

## 2024-01-21 MED ORDER — TRAMADOL HCL 50 MG PO TABS: 50.0000 mg | ORAL_TABLET | Freq: Four times a day (QID) | ORAL | 0 refills | Status: AC | PRN

## 2024-01-21 NOTE — Discharge Summary (Signed)
    Physician Discharge Summary   Patient ID: Cassandra Irwin MRN: [id number] DOB/AGE: Apr 10, 1951 73 y.o.  Admit date: 01/20/2024  Discharge date: 01/21/2024  Discharge Diagnoses:  Principal Problem:   Neoplasm of uncertain behavior of thyroid  gland Active Problems:   Multiple thyroid  nodules   Discharged Condition: good  Hospital Course: Patient was admitted for observation following thyroid  lobectomy.  Post op course was uncomplicated.  Pain was well controlled.  Tolerated diet.  Patient was prepared for discharge home on POD#1.  Consults: None  Treatments: surgery: left thyroid  lobectomy  Discharge Exam: Blood pressure (!) 109/51, pulse 65, temperature 98.1 F (36.7 C), temperature source Oral, resp. rate 18, height 5' 5.5 (1.664 m), weight 68 kg, SpO2 95%. HEENT - clear Neck - wound dry and intact; mild STS; voice normal; Dermabond in place  Disposition: Home  Discharge Instructions     Diet - low sodium heart healthy   Complete by: As directed    Increase activity slowly   Complete by: As directed    No dressing needed   Complete by: As directed       Allergies as of 01/21/2024       Reactions   Benzoin Rash   And blisters   Other Rash   Steri strips        Medication List     TAKE these medications    atorvastatin  40 MG tablet Commonly known as: LIPITOR Take 1 tablet (40 mg total) by mouth daily. What changed: when to take this   cetirizine 10 MG tablet Commonly known as: ZYRTEC Take 10 mg by mouth daily as needed for allergies.   estradiol 0.05 MG/24HR patch Commonly known as: VIVELLE-DOT Place 1 patch onto the skin 2 (two) times a week. Sundays & Wednesdays   ibuprofen 200 MG tablet Commonly known as: ADVIL Take 400 mg by mouth every 8 (eight) hours as needed (pain).   progesterone 100 MG capsule Commonly known as: PROMETRIUM Take 100 mg by mouth every evening.   traMADol 50 MG tablet Commonly known as: ULTRAM Take 1-2 tablets  (50-100 mg total) by mouth every 6 (six) hours as needed for moderate pain (pain score 4-6).   VITAMIN D PO Take 1,000 Units by mouth 3 (three) times a week.               Discharge Care Instructions  (From admission, onward)           Start     Ordered   01/21/24 0000  No dressing needed        07 /23/25 0802            Follow-up Information     Eletha Boas, MD. Schedule an appointment as soon as possible for a visit in 3 week(s).   Specialty: General Surgery Why: For wound re-check Contact information: [address] [place] [address] [phone]                 Boas Eletha, MD [facility] Office: [phone]   Signed: Boas Eletha 01/21/2024, 8:03 AM

## 2024-01-22 ENCOUNTER — Ambulatory Visit: Payer: Self-pay | Admitting: Surgery

## 2024-01-22 NOTE — Progress Notes (Signed)
 Good news!  The final pathology is benign - no cancer!  Will have written report for the patient at her post op visit.  Krystal Spinner, MD [facility] practice Office: [phone]

## 2024-02-13 DIAGNOSIS — H2513 Age-related nuclear cataract, bilateral: Secondary | ICD-10-CM | POA: Diagnosis not present

## 2024-03-02 DIAGNOSIS — E041 Nontoxic single thyroid nodule: Secondary | ICD-10-CM | POA: Diagnosis not present

## 2024-03-02 DIAGNOSIS — Z9889 Other specified postprocedural states: Secondary | ICD-10-CM | POA: Diagnosis not present

## 2024-03-02 DIAGNOSIS — Z9089 Acquired absence of other organs: Secondary | ICD-10-CM | POA: Diagnosis not present

## 2024-04-06 ENCOUNTER — Other Ambulatory Visit (HOSPITAL_BASED_OUTPATIENT_CLINIC_OR_DEPARTMENT_OTHER): Payer: Self-pay

## 2024-04-06 DIAGNOSIS — Z23 Encounter for immunization: Secondary | ICD-10-CM | POA: Diagnosis not present

## 2024-04-06 MED ORDER — FLUZONE HIGH-DOSE 0.5 ML IM SUSY
0.5000 mL | PREFILLED_SYRINGE | Freq: Once | INTRAMUSCULAR | 0 refills | Status: AC
  Filled 2024-04-06: qty 0.5, 1d supply, fill #0

## 2024-05-18 ENCOUNTER — Other Ambulatory Visit: Payer: Self-pay | Admitting: Obstetrics and Gynecology

## 2024-05-18 DIAGNOSIS — Z1231 Encounter for screening mammogram for malignant neoplasm of breast: Secondary | ICD-10-CM

## 2024-06-28 ENCOUNTER — Ambulatory Visit
Admission: RE | Admit: 2024-06-28 | Discharge: 2024-06-28 | Disposition: A | Discharge status: Home / Self Care | Source: Ambulatory Visit | Attending: Obstetrics and Gynecology | Admitting: Obstetrics and Gynecology

## 2024-06-28 DIAGNOSIS — Z1231 Encounter for screening mammogram for malignant neoplasm of breast: Secondary | ICD-10-CM

## 2024-10-14 ENCOUNTER — Encounter: Payer: Self-pay | Admitting: Family Medicine
